# Patient Record
Sex: Female | Born: 1970 | Race: Black or African American | Hispanic: No | Smoking: Never smoker
Health system: Southern US, Community
[De-identification: ages and names within clinical notes are randomized; demographics above are authoritative.]

## PROBLEM LIST (undated history)

## (undated) DIAGNOSIS — E119 Type 2 diabetes mellitus without complications: Secondary | ICD-10-CM

## (undated) DIAGNOSIS — I10 Essential (primary) hypertension: Secondary | ICD-10-CM

## (undated) DIAGNOSIS — E785 Hyperlipidemia, unspecified: Secondary | ICD-10-CM

## (undated) DIAGNOSIS — J40 Bronchitis, not specified as acute or chronic: Secondary | ICD-10-CM

## (undated) DIAGNOSIS — E669 Obesity, unspecified: Secondary | ICD-10-CM

## (undated) DIAGNOSIS — R609 Edema, unspecified: Secondary | ICD-10-CM

## (undated) DIAGNOSIS — Z794 Long term (current) use of insulin: Secondary | ICD-10-CM

## (undated) DIAGNOSIS — IMO0001 Reserved for inherently not codable concepts without codable children: Secondary | ICD-10-CM

## (undated) DIAGNOSIS — G4700 Insomnia, unspecified: Secondary | ICD-10-CM

## (undated) DIAGNOSIS — K219 Gastro-esophageal reflux disease without esophagitis: Secondary | ICD-10-CM

## (undated) HISTORY — DX: Insomnia, unspecified: G47.00

## (undated) HISTORY — DX: Essential (primary) hypertension: I10

## (undated) HISTORY — DX: Long term (current) use of insulin: Z79.4

## (undated) HISTORY — DX: Obesity, unspecified: E66.9

## (undated) HISTORY — DX: Edema, unspecified: R60.9

## (undated) HISTORY — DX: Gastro-esophageal reflux disease without esophagitis: K21.9

## (undated) HISTORY — PX: NO PAST SURGERIES: SHX2092

## (undated) HISTORY — DX: Bronchitis, not specified as acute or chronic: J40

## (undated) HISTORY — DX: Hyperlipidemia, unspecified: E78.5

## (undated) HISTORY — DX: Reserved for inherently not codable concepts without codable children: IMO0001

## (undated) HISTORY — DX: Type 2 diabetes mellitus without complications: E11.9

---

## 2011-05-10 ENCOUNTER — Emergency Department (HOSPITAL_COMMUNITY): Payer: Managed Care, Other (non HMO)

## 2011-05-10 ENCOUNTER — Emergency Department (HOSPITAL_COMMUNITY)
Admission: EM | Admit: 2011-05-10 | Discharge: 2011-05-11 | Disposition: A | Discharge status: Home / Self Care | Payer: Managed Care, Other (non HMO) | Attending: Emergency Medicine | Admitting: Emergency Medicine

## 2011-05-10 DIAGNOSIS — E119 Type 2 diabetes mellitus without complications: Secondary | ICD-10-CM | POA: Insufficient documentation

## 2011-05-10 DIAGNOSIS — M79609 Pain in unspecified limb: Secondary | ICD-10-CM | POA: Insufficient documentation

## 2011-05-10 DIAGNOSIS — I10 Essential (primary) hypertension: Secondary | ICD-10-CM | POA: Insufficient documentation

## 2011-05-10 DIAGNOSIS — M25519 Pain in unspecified shoulder: Secondary | ICD-10-CM | POA: Insufficient documentation

## 2011-05-10 DIAGNOSIS — Z79899 Other long term (current) drug therapy: Secondary | ICD-10-CM | POA: Insufficient documentation

## 2011-05-10 DIAGNOSIS — E7800 Pure hypercholesterolemia, unspecified: Secondary | ICD-10-CM | POA: Insufficient documentation

## 2011-05-10 DIAGNOSIS — R079 Chest pain, unspecified: Secondary | ICD-10-CM | POA: Insufficient documentation

## 2011-05-10 LAB — COMPREHENSIVE METABOLIC PANEL
ALT: 13 U/L (ref 0–35)
Albumin: 3.2 g/dL — ABNORMAL LOW (ref 3.5–5.2)
Alkaline Phosphatase: 71 U/L (ref 39–117)
Potassium: 3.9 mEq/L (ref 3.5–5.1)
Sodium: 138 mEq/L (ref 135–145)
Total Protein: 6.9 g/dL (ref 6.0–8.3)

## 2011-05-10 LAB — URINE MICROSCOPIC-ADD ON

## 2011-05-10 LAB — CK TOTAL AND CKMB (NOT AT ARMC)
CK, MB: 1.9 ng/mL (ref 0.3–4.0)
Relative Index: 1.2 (ref 0.0–2.5)

## 2011-05-10 LAB — DIFFERENTIAL
Basophils Absolute: 0.1 10*3/uL (ref 0.0–0.1)
Lymphocytes Relative: 42 % (ref 12–46)
Neutro Abs: 4.5 10*3/uL (ref 1.7–7.7)

## 2011-05-10 LAB — CBC
HCT: 38.4 % (ref 36.0–46.0)
Hemoglobin: 12.8 g/dL (ref 12.0–15.0)
WBC: 9.7 10*3/uL (ref 4.0–10.5)

## 2011-05-10 LAB — URINALYSIS, ROUTINE W REFLEX MICROSCOPIC
Bilirubin Urine: NEGATIVE
Nitrite: NEGATIVE
Specific Gravity, Urine: 1.017 (ref 1.005–1.030)
pH: 5 (ref 5.0–8.0)

## 2011-05-10 LAB — TROPONIN I: Troponin I: <0.3 ng/mL (ref <0.30)

## 2011-05-10 LAB — POCT PREGNANCY, URINE: Preg Test, Ur: NEGATIVE

## 2011-05-11 LAB — CK TOTAL AND CKMB (NOT AT ARMC)
CK, MB: 1.8 ng/mL (ref 0.3–4.0)
Relative Index: 1.2 (ref 0.0–2.5)
Total CK: 155 U/L (ref 7–177)

## 2011-05-11 LAB — TROPONIN I: Troponin I: <0.3 ng/mL (ref <0.30)

## 2011-10-10 ENCOUNTER — Other Ambulatory Visit: Payer: Self-pay | Admitting: Gastroenterology

## 2011-10-10 DIAGNOSIS — K219 Gastro-esophageal reflux disease without esophagitis: Secondary | ICD-10-CM

## 2011-10-15 ENCOUNTER — Other Ambulatory Visit (HOSPITAL_COMMUNITY): Payer: Managed Care, Other (non HMO)

## 2011-10-25 ENCOUNTER — Encounter (HOSPITAL_COMMUNITY)
Admission: RE | Admit: 2011-10-25 | Discharge: 2011-10-25 | Disposition: A | Discharge status: Home / Self Care | Payer: Managed Care, Other (non HMO) | Source: Ambulatory Visit | Attending: Gastroenterology | Admitting: Gastroenterology

## 2011-10-25 DIAGNOSIS — K219 Gastro-esophageal reflux disease without esophagitis: Secondary | ICD-10-CM

## 2011-10-25 MED ORDER — TECHNETIUM TC 99M MEBROFENIN IV KIT
5.4000 | PACK | Freq: Once | INTRAVENOUS | Status: AC | PRN
  Administered 2011-10-25: 5 via INTRAVENOUS

## 2011-10-25 MED ORDER — SINCALIDE 5 MCG IJ SOLR: 0.0200 ug/kg | Freq: Once | INTRAMUSCULAR | Status: DC

## 2011-11-22 ENCOUNTER — Encounter (INDEPENDENT_AMBULATORY_CARE_PROVIDER_SITE_OTHER): Payer: Self-pay

## 2011-11-26 ENCOUNTER — Ambulatory Visit (INDEPENDENT_AMBULATORY_CARE_PROVIDER_SITE_OTHER): Payer: Managed Care, Other (non HMO) | Admitting: General Surgery

## 2011-11-26 DIAGNOSIS — K828 Other specified diseases of gallbladder: Secondary | ICD-10-CM | POA: Insufficient documentation

## 2011-11-28 ENCOUNTER — Encounter (INDEPENDENT_AMBULATORY_CARE_PROVIDER_SITE_OTHER): Payer: Self-pay | Admitting: General Surgery

## 2012-01-14 ENCOUNTER — Ambulatory Visit (INDEPENDENT_AMBULATORY_CARE_PROVIDER_SITE_OTHER): Payer: Managed Care, Other (non HMO) | Admitting: General Surgery

## 2012-01-14 ENCOUNTER — Encounter (INDEPENDENT_AMBULATORY_CARE_PROVIDER_SITE_OTHER): Payer: Self-pay | Admitting: General Surgery

## 2012-01-14 VITALS — BP 142/88 | Temp 98.4°F | Ht 67.0 in | Wt 291.0 lb

## 2012-01-14 DIAGNOSIS — K828 Other specified diseases of gallbladder: Secondary | ICD-10-CM

## 2012-01-14 NOTE — Progress Notes (Signed)
Patient ID: Andrea Wang, female   DOB: 09/15/1970, 41 y.o.   MRN: [id number]  No chief complaint on file.   HPI Andrea Wang is a 41 y.o. female.  Symptomatic biliary dyskinesia HPI  Past Medical History  Diagnosis Date  . IDDM (insulin dependent diabetes mellitus)   . Hyperlipidemia   . Hypertension   . GERD (gastroesophageal reflux disease)   . Bronchitis   . Asthma   . Peripheral edema   . Insomnia   . Obesity   . Diabetes mellitus     No past surgical history on file.  Family History  Problem Relation Age of Onset  . Ovarian cancer Maternal Grandmother   . Ovarian cancer Maternal Aunt   . Heart failure Mother   . Aneurysm Father     Social History History  Substance Use Topics  . Smoking status: Never Smoker   . Smokeless tobacco: Not on file  . Alcohol Use: No    No Known Allergies  Current Outpatient Prescriptions  Medication Sig Dispense Refill  . AMLODIPINE BESYLATE PO Take by mouth.      . aspirin 81 MG tablet Take 81 mg by mouth daily.      . atorvastatin (LIPITOR) 10 MG tablet Take 10 mg by mouth daily.      . Dexlansoprazole (DEXILANT PO) Take by mouth.      . Docusate Calcium (STOOL SOFTENER PO) Take by mouth.      . insulin NPH-insulin regular (NOVOLIN 70/30) (70-30) 100 UNIT/ML injection Inject into the skin.      . lisinopril (PRINIVIL,ZESTRIL) 5 MG tablet Take 5 mg by mouth daily.      . METFORMIN HCL PO Take by mouth.        Review of Systems Review of Systems  Constitutional: Negative.   HENT: Negative.   Gastrointestinal: Positive for abdominal pain (left upper quadrant).  Genitourinary: Negative.   Musculoskeletal: Negative.   Neurological: Positive for numbness (right arm numbness).    Blood pressure 142/88, temperature 98.4 F (36.9 C), height 5' 7" (1.702 m), weight 291 lb (131.997 kg).  Physical Exam Physical Exam  Constitutional: She is oriented to person, place, and time. She appears well-developed and well-nourished.    HENT:  Head: Normocephalic and atraumatic.  Eyes: Conjunctivae and EOM are normal. Pupils are equal, round, and reactive to light.  Neck: Normal range of motion. Neck supple.  Cardiovascular: Normal rate, regular rhythm and normal heart sounds.   Pulmonary/Chest: Effort normal and breath sounds normal.  Abdominal: Soft. Bowel sounds are normal.  Musculoskeletal: Normal range of motion.  Neurological: She is alert and oriented to person, place, and time. She has normal reflexes.  Skin: Skin is warm and dry.  Psychiatric: She has a normal mood and affect. Her behavior is normal. Judgment and thought content normal.    Data Reviewed HIDA scan and ultrasound.  Assessment    Symptomatic biliary dyskinesia with no stones on ultrasound.    Plan    Laparoscopic cholecystectomy with possible IOC Risk and benefits have been explained to the patient and she wishes to proceed.       Arloa Prak III,Asif Muchow O 01/14/2012, 11:09 AM    

## 2012-01-24 ENCOUNTER — Encounter (HOSPITAL_COMMUNITY): Payer: Self-pay | Admitting: *Deleted

## 2012-01-24 NOTE — Pre-Procedure Instructions (Addendum)
20 Andrea Wang  01/24/2012   Your procedure is scheduled on: 02-03-2012 Report to [facility] at 5:30 AM.  Call this number if you have problems the morning of surgery: [phone]   Remember:   Do not eat or drink:After Midnight.    Take these medicines the morning of surgery with A SIP OF WATER:amlodipine,dexilant.   Do not wear jewelry, make-up or nail polish.  Do not wear lotions, powders, or perfumes. You may wear deodorant.  Do not shave 48 hours prior to surgery. Men may shave face and neck.  Do not bring valuables to the hospital.  Contacts, dentures or bridgework may not be worn into surgery.  Leave suitcase in the car. After surgery it may be brought to your room.  For patients admitted to the hospital, checkout time is 11:00 AM the day of discharge.   Patients discharged the day of surgery will not be allowed to drive home.  Name and phone number of your driver:  Special Instructions: CHG Shower Use Special Wash: 1/2 bottle night before surgery and 1/2 bottle morning of surgery.   Please read over the following fact sheets that you were given: Pain Booklet, Coughing and Deep Breathing and Surgical Site Infection Prevention

## 2012-01-24 NOTE — Progress Notes (Signed)
Attempted to call pt. To review health history,unable to reach, message left on voice mail .

## 2012-01-29 ENCOUNTER — Encounter (HOSPITAL_COMMUNITY)
Admission: RE | Admit: 2012-01-29 | Discharge: 2012-01-29 | Disposition: A | Discharge status: Home / Self Care | Payer: Managed Care, Other (non HMO) | Source: Ambulatory Visit | Attending: General Surgery | Admitting: General Surgery

## 2012-01-29 ENCOUNTER — Encounter (HOSPITAL_COMMUNITY): Payer: Self-pay | Admitting: Pharmacy Technician

## 2012-01-29 LAB — COMPREHENSIVE METABOLIC PANEL
ALT: 13 U/L (ref 0–35)
AST: 17 U/L (ref 0–37)
Alkaline Phosphatase: 70 U/L (ref 39–117)
CO2: 24 mEq/L (ref 19–32)
Calcium: 9.5 mg/dL (ref 8.4–10.5)
GFR calc non Af Amer: 47 mL/min — ABNORMAL LOW (ref >90)
Potassium: 4.5 mEq/L (ref 3.5–5.1)
Sodium: 138 mEq/L (ref 135–145)

## 2012-01-29 LAB — DIFFERENTIAL
Eosinophils Absolute: 0.4 10*3/uL (ref 0.0–0.7)
Eosinophils Relative: 4 % (ref 0–5)
Lymphocytes Relative: 37 % (ref 12–46)
Lymphs Abs: 3.3 10*3/uL (ref 0.7–4.0)
Monocytes Absolute: 0.6 10*3/uL (ref 0.1–1.0)

## 2012-01-29 LAB — HCG, SERUM, QUALITATIVE: Preg, Serum: NEGATIVE

## 2012-01-29 LAB — CBC
Hemoglobin: 11.4 g/dL — ABNORMAL LOW (ref 12.0–15.0)
MCH: 25.1 pg — ABNORMAL LOW (ref 26.0–34.0)
Platelets: 278 10*3/uL (ref 150–400)
RBC: 4.55 MIL/uL (ref 3.87–5.11)
WBC: 8.9 10*3/uL (ref 4.0–10.5)

## 2012-02-02 MED ORDER — DEXTROSE 5 % IV SOLN
2.0000 g | INTRAVENOUS | Status: AC
  Administered 2012-02-03: 2 g via INTRAVENOUS
  Filled 2012-02-02: qty 2

## 2012-02-03 ENCOUNTER — Ambulatory Visit (HOSPITAL_COMMUNITY)
Admission: RE | Admit: 2012-02-03 | Discharge: 2012-02-03 | Disposition: A | Discharge status: Home / Self Care | Payer: Managed Care, Other (non HMO) | Source: Ambulatory Visit | Attending: General Surgery | Admitting: General Surgery

## 2012-02-03 ENCOUNTER — Ambulatory Visit (HOSPITAL_COMMUNITY): Payer: Managed Care, Other (non HMO) | Admitting: Anesthesiology

## 2012-02-03 ENCOUNTER — Encounter (HOSPITAL_COMMUNITY): Payer: Self-pay | Admitting: Anesthesiology

## 2012-02-03 ENCOUNTER — Encounter (HOSPITAL_COMMUNITY): Payer: Self-pay | Admitting: *Deleted

## 2012-02-03 ENCOUNTER — Encounter (HOSPITAL_COMMUNITY)
Admission: RE | Disposition: A | Discharge status: Home / Self Care | Payer: Self-pay | Source: Ambulatory Visit | Attending: General Surgery

## 2012-02-03 DIAGNOSIS — I10 Essential (primary) hypertension: Secondary | ICD-10-CM | POA: Insufficient documentation

## 2012-02-03 DIAGNOSIS — K8010 Calculus of gallbladder with chronic cholecystitis without obstruction: Secondary | ICD-10-CM

## 2012-02-03 DIAGNOSIS — E669 Obesity, unspecified: Secondary | ICD-10-CM | POA: Insufficient documentation

## 2012-02-03 DIAGNOSIS — Z794 Long term (current) use of insulin: Secondary | ICD-10-CM | POA: Insufficient documentation

## 2012-02-03 DIAGNOSIS — K219 Gastro-esophageal reflux disease without esophagitis: Secondary | ICD-10-CM | POA: Insufficient documentation

## 2012-02-03 DIAGNOSIS — J45909 Unspecified asthma, uncomplicated: Secondary | ICD-10-CM | POA: Insufficient documentation

## 2012-02-03 DIAGNOSIS — E119 Type 2 diabetes mellitus without complications: Secondary | ICD-10-CM | POA: Insufficient documentation

## 2012-02-03 DIAGNOSIS — Z01812 Encounter for preprocedural laboratory examination: Secondary | ICD-10-CM | POA: Insufficient documentation

## 2012-02-03 DIAGNOSIS — K828 Other specified diseases of gallbladder: Secondary | ICD-10-CM | POA: Insufficient documentation

## 2012-02-03 DIAGNOSIS — E785 Hyperlipidemia, unspecified: Secondary | ICD-10-CM | POA: Insufficient documentation

## 2012-02-03 HISTORY — PX: CHOLECYSTECTOMY: SHX55

## 2012-02-03 LAB — GLUCOSE, CAPILLARY
Glucose-Capillary: 107 mg/dL — ABNORMAL HIGH (ref 70–99)
Glucose-Capillary: 100 mg/dL — ABNORMAL HIGH (ref 70–99)
Glucose-Capillary: 102 mg/dL — ABNORMAL HIGH (ref 70–99)

## 2012-02-03 SURGERY — LAPAROSCOPIC CHOLECYSTECTOMY
Anesthesia: General | Site: Abdomen | Wound class: Clean Contaminated

## 2012-02-03 MED ORDER — NEOSTIGMINE METHYLSULFATE 1 MG/ML IJ SOLN
INTRAMUSCULAR | Status: DC | PRN
  Administered 2012-02-03: 3 mg via INTRAVENOUS

## 2012-02-03 MED ORDER — HYDROMORPHONE HCL PF 1 MG/ML IJ SOLN
INTRAMUSCULAR | Status: AC
  Filled 2012-02-03: qty 1

## 2012-02-03 MED ORDER — IOHEXOL 300 MG/ML  SOLN
INTRAMUSCULAR | Status: DC | PRN
  Administered 2012-02-03: 07:00:00

## 2012-02-03 MED ORDER — LACTATED RINGERS IV SOLN
INTRAVENOUS | Status: DC | PRN
  Administered 2012-02-03 (×2): via INTRAVENOUS

## 2012-02-03 MED ORDER — FENTANYL CITRATE 0.05 MG/ML IJ SOLN
INTRAMUSCULAR | Status: DC | PRN
  Administered 2012-02-03: 50 ug via INTRAVENOUS
  Administered 2012-02-03: 100 ug via INTRAVENOUS
  Administered 2012-02-03: 50 ug via INTRAVENOUS

## 2012-02-03 MED ORDER — IBUPROFEN 200 MG PO TABS: 200.0000 mg | ORAL_TABLET | Freq: Four times a day (QID) | ORAL | Status: DC | PRN

## 2012-02-03 MED ORDER — KETOROLAC TROMETHAMINE 30 MG/ML IJ SOLN
15.0000 mg | Freq: Once | INTRAMUSCULAR | Status: AC | PRN
  Administered 2012-02-03: 30 mg via INTRAVENOUS

## 2012-02-03 MED ORDER — GLYCOPYRROLATE 0.2 MG/ML IJ SOLN
INTRAMUSCULAR | Status: DC | PRN
  Administered 2012-02-03: 0.4 mg via INTRAVENOUS

## 2012-02-03 MED ORDER — MIDAZOLAM HCL 5 MG/5ML IJ SOLN
INTRAMUSCULAR | Status: DC | PRN
  Administered 2012-02-03: 2 mg via INTRAVENOUS

## 2012-02-03 MED ORDER — LIDOCAINE HCL (CARDIAC) 20 MG/ML IV SOLN
INTRAVENOUS | Status: DC | PRN
  Administered 2012-02-03: 100 mg via INTRAVENOUS

## 2012-02-03 MED ORDER — KETOROLAC TROMETHAMINE 30 MG/ML IJ SOLN
INTRAMUSCULAR | Status: AC
  Filled 2012-02-03: qty 1

## 2012-02-03 MED ORDER — MEPERIDINE HCL 25 MG/ML IJ SOLN: 6.2500 mg | INTRAMUSCULAR | Status: DC | PRN

## 2012-02-03 MED ORDER — OXYCODONE-ACETAMINOPHEN 7.5-500 MG PO TABS: 1.0000 | ORAL_TABLET | Freq: Four times a day (QID) | ORAL | Status: AC | PRN

## 2012-02-03 MED ORDER — SODIUM CHLORIDE 0.9 % IR SOLN
Status: DC | PRN
  Administered 2012-02-03 (×2): 1000 mL

## 2012-02-03 MED ORDER — BUPIVACAINE-EPINEPHRINE 0.25% -1:200000 IJ SOLN
INTRAMUSCULAR | Status: DC | PRN
  Administered 2012-02-03: 10 mL

## 2012-02-03 MED ORDER — PROPOFOL 10 MG/ML IV EMUL
INTRAVENOUS | Status: DC | PRN
  Administered 2012-02-03: 170 mg via INTRAVENOUS

## 2012-02-03 MED ORDER — ROCURONIUM BROMIDE 100 MG/10ML IV SOLN
INTRAVENOUS | Status: DC | PRN
  Administered 2012-02-03: 50 mg via INTRAVENOUS

## 2012-02-03 MED ORDER — OXYCODONE HCL 5 MG PO TABS: 5.0000 mg | ORAL_TABLET | Freq: Once | ORAL | Status: DC | PRN

## 2012-02-03 MED ORDER — ONDANSETRON HCL 4 MG/2ML IJ SOLN
INTRAMUSCULAR | Status: DC | PRN
  Administered 2012-02-03: 4 mg via INTRAVENOUS

## 2012-02-03 MED ORDER — HYDROMORPHONE HCL PF 1 MG/ML IJ SOLN
0.2500 mg | INTRAMUSCULAR | Status: DC | PRN
  Administered 2012-02-03 (×2): 0.5 mg via INTRAVENOUS

## 2012-02-03 SURGICAL SUPPLY — 42 items
APPLIER CLIP 5 13 M/L LIGAMAX5 (MISCELLANEOUS) ×3
APPLIER CLIP ROT 10 11.4 M/L (STAPLE)
BLADE SURG ROTATE 9660 (MISCELLANEOUS) IMPLANT
CANISTER SUCTION 2500CC (MISCELLANEOUS) ×3 IMPLANT
CHLORAPREP W/TINT 26ML (MISCELLANEOUS) ×3 IMPLANT
CLIP APPLIE 5 13 M/L LIGAMAX5 (MISCELLANEOUS) ×2 IMPLANT
CLIP APPLIE ROT 10 11.4 M/L (STAPLE) IMPLANT
CLOTH BEACON ORANGE TIMEOUT ST (SAFETY) ×3 IMPLANT
COVER MAYO STAND STRL (DRAPES) ×3 IMPLANT
COVER SURGICAL LIGHT HANDLE (MISCELLANEOUS) ×3 IMPLANT
DECANTER SPIKE VIAL GLASS SM (MISCELLANEOUS) IMPLANT
DERMABOND ADVANCED (GAUZE/BANDAGES/DRESSINGS) ×1
DERMABOND ADVANCED .7 DNX12 (GAUZE/BANDAGES/DRESSINGS) ×2 IMPLANT
DRAPE C-ARM 42X72 X-RAY (DRAPES) ×3 IMPLANT
DRAPE UTILITY 15X26 W/TAPE STR (DRAPE) ×6 IMPLANT
ELECT REM PT RETURN 9FT ADLT (ELECTROSURGICAL) ×3
ELECTRODE REM PT RTRN 9FT ADLT (ELECTROSURGICAL) ×2 IMPLANT
GLOVE BIOGEL PI IND STRL 7.0 (GLOVE) ×2 IMPLANT
GLOVE BIOGEL PI IND STRL 8 (GLOVE) ×2 IMPLANT
GLOVE BIOGEL PI INDICATOR 7.0 (GLOVE) ×1
GLOVE BIOGEL PI INDICATOR 8 (GLOVE) ×1
GLOVE ECLIPSE 6.5 STRL STRAW (GLOVE) ×3 IMPLANT
GLOVE ECLIPSE 7.5 STRL STRAW (GLOVE) ×3 IMPLANT
GOWN STRL NON-REIN LRG LVL3 (GOWN DISPOSABLE) ×12 IMPLANT
KIT BASIN OR (CUSTOM PROCEDURE TRAY) ×3 IMPLANT
KIT ROOM TURNOVER OR (KITS) ×3 IMPLANT
NS IRRIG 1000ML POUR BTL (IV SOLUTION) ×3 IMPLANT
PAD ARMBOARD 7.5X6 YLW CONV (MISCELLANEOUS) IMPLANT
POUCH SPECIMEN RETRIEVAL 10MM (ENDOMECHANICALS) ×3 IMPLANT
SCISSORS LAP 5X35 DISP (ENDOMECHANICALS) ×3 IMPLANT
SET CHOLANGIOGRAPH 5 50 .035 (SET/KITS/TRAYS/PACK) ×3 IMPLANT
SET IRRIG TUBING LAPAROSCOPIC (IRRIGATION / IRRIGATOR) ×3 IMPLANT
SLEEVE ENDOPATH XCEL 5M (ENDOMECHANICALS) ×6 IMPLANT
SPECIMEN JAR SMALL (MISCELLANEOUS) ×3 IMPLANT
SUT MNCRL AB 4-0 PS2 18 (SUTURE) ×6 IMPLANT
TOWEL OR 17X24 6PK STRL BLUE (TOWEL DISPOSABLE) ×3 IMPLANT
TOWEL OR 17X26 10 PK STRL BLUE (TOWEL DISPOSABLE) ×3 IMPLANT
TRAY LAPAROSCOPIC (CUSTOM PROCEDURE TRAY) ×3 IMPLANT
TROCAR XCEL BLUNT TIP 100MML (ENDOMECHANICALS) ×3 IMPLANT
TROCAR XCEL NON-BLD 11X100MML (ENDOMECHANICALS) ×3 IMPLANT
TROCAR XCEL NON-BLD 5MMX100MML (ENDOMECHANICALS) ×3 IMPLANT
WATER STERILE IRR 1000ML POUR (IV SOLUTION) IMPLANT

## 2012-02-03 NOTE — Progress Notes (Signed)
Patient encouraged to drink fluid. Patient reports that pain is 5/10 and is tolerable. Patient continues to drink ginger ale and eat ice chips.

## 2012-02-03 NOTE — Interval H&P Note (Signed)
History and Physical Interval Note:  02/03/2012 7:08 AM  Andrea Wang  has presented today for surgery, with the diagnosis of Symptomatic biliary dyskinesia  The various methods of treatment have been discussed with the patient and family. After consideration of risks, benefits and other options for treatment, the patient has consented to  Procedure(s) (LRB): LAPAROSCOPIC CHOLECYSTECTOMY WITH INTRAOPERATIVE CHOLANGIOGRAM (N/A) as a surgical intervention .  The patients' history has been reviewed, patient examined, no change in status, stable for surgery.  I have reviewed the patients' chart and labs.  Questions were answered to the patient's satisfaction.     Andrea Wang O  No worsening concerns.  Ready for the OR.  Andrea Wang. Andrea Bon, MD, FACS [phone] [facility]

## 2012-02-03 NOTE — H&P (View-Only) (Signed)
Patient ID: Andrea Wang, female   DOB: 03-16-1971, 41 y.o.   MRN: [id number]  No chief complaint on file.   HPI Andrea Wang is a 41 y.o. female.  Symptomatic biliary dyskinesia HPI  Past Medical History  Diagnosis Date  . IDDM (insulin dependent diabetes mellitus)   . Hyperlipidemia   . Hypertension   . GERD (gastroesophageal reflux disease)   . Bronchitis   . Asthma   . Peripheral edema   . Insomnia   . Obesity   . Diabetes mellitus     No past surgical history on file.  Family History  Problem Relation Age of Onset  . Ovarian cancer Maternal Grandmother   . Ovarian cancer Maternal Aunt   . Heart failure Mother   . Aneurysm Father     Social History History  Substance Use Topics  . Smoking status: Never Smoker   . Smokeless tobacco: Not on file  . Alcohol Use: No    No Known Allergies  Current Outpatient Prescriptions  Medication Sig Dispense Refill  . AMLODIPINE BESYLATE PO Take by mouth.      Marland Kitchen aspirin 81 MG tablet Take 81 mg by mouth daily.      Marland Kitchen atorvastatin (LIPITOR) 10 MG tablet Take 10 mg by mouth daily.      Marland Kitchen Dexlansoprazole (DEXILANT PO) Take by mouth.      Tery Sanfilippo Calcium (STOOL SOFTENER PO) Take by mouth.      . insulin NPH-insulin regular (NOVOLIN 70/30) (70-30) 100 UNIT/ML injection Inject into the skin.      Marland Kitchen lisinopril (PRINIVIL,ZESTRIL) 5 MG tablet Take 5 mg by mouth daily.      Marland Kitchen METFORMIN HCL PO Take by mouth.        Review of Systems Review of Systems  Constitutional: Negative.   HENT: Negative.   Gastrointestinal: Positive for abdominal pain (left upper quadrant).  Genitourinary: Negative.   Musculoskeletal: Negative.   Neurological: Positive for numbness (right arm numbness).    Blood pressure 142/88, temperature 98.4 F (36.9 C), height 5\' 7"  (1.702 m), weight 291 lb (131.997 kg).  Physical Exam Physical Exam  Constitutional: She is oriented to person, place, and time. She appears well-developed and well-nourished.    HENT:  Head: Normocephalic and atraumatic.  Eyes: Conjunctivae and EOM are normal. Pupils are equal, round, and reactive to light.  Neck: Normal range of motion. Neck supple.  Cardiovascular: Normal rate, regular rhythm and normal heart sounds.   Pulmonary/Chest: Effort normal and breath sounds normal.  Abdominal: Soft. Bowel sounds are normal.  Musculoskeletal: Normal range of motion.  Neurological: She is alert and oriented to person, place, and time. She has normal reflexes.  Skin: Skin is warm and dry.  Psychiatric: She has a normal mood and affect. Her behavior is normal. Judgment and thought content normal.    Data Reviewed HIDA scan and ultrasound.  Assessment    Symptomatic biliary dyskinesia with no stones on ultrasound.    Plan    Laparoscopic cholecystectomy with possible IOC Risk and benefits have been explained to the patient and she wishes to proceed.       Jaree Trinka III,Lea Walbert O 01/14/2012, 11:09 AM

## 2012-02-03 NOTE — Anesthesia Procedure Notes (Addendum)
Procedure Name: Intubation Date/Time: 02/03/2012 7:39 AM Performed by: Jerilee Hoh Pre-anesthesia Checklist: Patient identified, Emergency Drugs available, Suction available and Patient being monitored Patient Re-evaluated:Patient Re-evaluated prior to inductionOxygen Delivery Method: Circle system utilized Preoxygenation: Pre-oxygenation with 100% oxygen Intubation Type: IV induction Ventilation: Mask ventilation without difficulty Laryngoscope Size: Mac and 3 Grade View: Grade II Tube type: Oral Tube size: 7.5 mm Number of attempts: 1 Airway Equipment and Method: Stylet Placement Confirmation: ETT inserted through vocal cords under direct vision,  positive ETCO2 and breath sounds checked- equal and bilateral Secured at: 22 cm Tube secured with: Tape Dental Injury: Teeth and Oropharynx as per pre-operative assessment

## 2012-02-03 NOTE — Progress Notes (Signed)
Patient's IV was DCd intact. Site unremarkable. Right upper transparent dressing-had small amount bloody drainage under dressing when she got up to urinate. Rechecked after 20 minutes of sitting on edge of bed with no increase in drainage. Patient aware of dressing DC instructions.

## 2012-02-03 NOTE — Anesthesia Preprocedure Evaluation (Addendum)
Anesthesia Evaluation  Patient identified by MRN, date of birth, ID band Patient awake    Reviewed: Allergy & Precautions, H&P , NPO status , Patient's Chart, lab work & pertinent test results, reviewed documented beta blocker date and time   History of Anesthesia Complications Negative for: history of anesthetic complications  Airway Mallampati: I TM Distance: >3 FB Neck ROM: Full    Dental  (+) Teeth Intact and Dental Advisory Given   Pulmonary asthma ,  breath sounds clear to auscultation        Cardiovascular hypertension, Rhythm:Regular Rate:Normal     Neuro/Psych    GI/Hepatic GERD-  ,  Endo/Other  Diabetes mellitus-, Insulin Dependent  Renal/GU      Musculoskeletal   Abdominal (+) + obese,   Peds  Hematology   Anesthesia Other Findings   Reproductive/Obstetrics                          Anesthesia Physical Anesthesia Plan  ASA: III  Anesthesia Plan: General   Post-op Pain Management:    Induction: Intravenous  Airway Management Planned: Oral ETT  Additional Equipment:   Intra-op Plan:   Post-operative Plan:   Informed Consent: I have reviewed the patients History and Physical, chart, labs and discussed the procedure including the risks, benefits and alternatives for the proposed anesthesia with the patient or authorized representative who has indicated his/her understanding and acceptance.   Dental advisory given  Plan Discussed with: CRNA, Surgeon and Anesthesiologist  Anesthesia Plan Comments:        Anesthesia Quick Evaluation

## 2012-02-03 NOTE — Transfer of Care (Signed)
Immediate Anesthesia Transfer of Care Note  Patient: Andrea Wang  Procedure(s) Performed: Procedure(s) (LRB): LAPAROSCOPIC CHOLECYSTECTOMY (N/A)  Patient Location: PACU  Anesthesia Type: General  Level of Consciousness: awake, alert , oriented and patient cooperative  Airway & Oxygen Therapy: Patient Spontanous Breathing and Patient connected to nasal cannula oxygen  Post-op Assessment: Report given to PACU RN, Post -op Vital signs reviewed and stable and Patient moving all extremities  Post vital signs: Reviewed and stable  Complications: No apparent anesthesia complications

## 2012-02-03 NOTE — Op Note (Signed)
OPERATIVE REPORT  DATE OF OPERATION: 02/03/2012  PATIENT:  Andrea Wang  41 y.o. female  PRE-OPERATIVE DIAGNOSIS:  Symptomatic biliary dyskinesia  POST-OPERATIVE DIAGNOSIS:  Symptomatic biliary dyskinesia  PROCEDURE:  Procedure(s): LAPAROSCOPIC CHOLECYSTECTOMY  SURGEON:  Surgeon(s): Cherylynn Ridges, MD  ASSISTANT: [place], PA-S  ANESTHESIA:   general  EBL: <20 ml  BLOOD ADMINISTERED: none  DRAINS: none   SPECIMEN:  Source of Specimen:  Galbladder  COUNTS CORRECT:  YES  PROCEDURE DETAILS: The patient was taken to the operating room and placed on the table in the supine position.  After an adequate endotracheal anesthetic was administered, (she/he) was prepped with (ChloroPrep/Betadine), and then draped in the usual manner exposing the entire abdomen laterally, inferiorly and up  to the costal margins.  After a proper timeout was performed including identifying the patient and the procedure to be performed, a supra-umbilical1.5cm midline incision was made using a #15blade.  This was taken down to the fascia which was then incised with a #15 blade.  The edges of the fascia were tented up with Kocher clamps as the preperitoneal space was penetrated with a Kelly clamp into the peritoneum.  Once this was done, a pursestring suture of 0 Vicryl was passed around the fascial opening.  This was subsequently used to secure the [facility] cannula which was passed into the peritoneal cavity.  Once the [facility] cannula was in place, carbon dioxide gas was insufflated into the peritoneal cavity up to a maximal intra-abdominal pressure of 15mm Hg.The laparoscope, with attached camera and light source, was passed into the peritoneal cavity to visualize the direct insertion of two right upper quadrant 5mm cannulas, and a sup-xiphoid 10-22mm cannula.  Once all cannulas were in place, the dissection was begun.  Two ratcheted graspers were attached to the dome and infundibulum of the gallbladder and  retracted towards the anterior abdominal wall and the right upper quadrant.  Using cautery attached to a dissecting forceps, the peritoneum overlaying the triangle of Chalot and the hepatoduodenal triangle was dissected away exposing the cystic duct and the cystic artery.  A clip was placed on the gallbladder side of the cystic duct, then 3 clips placed distally and the duct was transected.  The cystic artery was dissected free then clipped and transected.  The gallbladder was then dissected out of the hepatic bed without event.  It was retrieved from the abdomen (using an EndoCatch bag) without event, although bile spilled form the gallbladder from small holes that were made in the body.  Most of the spilled bile was removed using the irrigation and aspiration.  Once the gallbladder was removed, the bed was inspected for hemostasis.  Once excellent hemostasis was obtained all gas and fluids were aspirated from above the liver, then the cannulas were removed.  The supra-umbilical incision was closed using the pursestring suture which was in place.  0.25% bupivicaine with epinephrine was injected at all sites.  All 10mm or greater cannula sites were close using a running subcuticular stitch of 4-0 Monocryl.  5.72mm cannula sites were closed with Dermabond only.Steri-Strips and Tagaderm were used to complete the dressings at all sites.  At this point all needle, sponge, and instrument counts were correct.The patient was awakened from anesthesia and taken to the PACU in stable condition.  PATIENT DISPOSITION:  PACU - hemodynamically stable.   Cherylynn Ridges 6/17/20138:58 AM

## 2012-02-03 NOTE — Preoperative (Signed)
Beta Blockers   Reason not to administer Beta Blockers:Not Applicable 

## 2012-02-03 NOTE — Anesthesia Postprocedure Evaluation (Signed)
  Anesthesia Post-op Note  Patient: Andrea Wang  Procedure(s) Performed: Procedure(s) (LRB): LAPAROSCOPIC CHOLECYSTECTOMY (N/A)  Patient Location: PACU  Anesthesia Type: General  Level of Consciousness: awake and sedated  Airway and Oxygen Therapy: Patient Spontanous Breathing  Post-op Pain: mild  Post-op Assessment: Post-op Vital signs reviewed  Post-op Vital Signs: stable  Complications: No apparent anesthesia complications

## 2012-02-03 NOTE — Progress Notes (Signed)
Report given to stephanie rn as caregiver 

## 2012-02-04 ENCOUNTER — Telehealth (INDEPENDENT_AMBULATORY_CARE_PROVIDER_SITE_OTHER): Payer: Self-pay | Admitting: General Surgery

## 2012-02-04 ENCOUNTER — Encounter (HOSPITAL_COMMUNITY): Payer: Self-pay | Admitting: General Surgery

## 2012-02-04 NOTE — Telephone Encounter (Signed)
Pt home from lap chole and asking for clarification of instructions for removing dressing to bathe.  Instructions discussed and she understands.

## 2012-02-05 ENCOUNTER — Telehealth (INDEPENDENT_AMBULATORY_CARE_PROVIDER_SITE_OTHER): Payer: Self-pay | Admitting: General Surgery

## 2012-02-05 NOTE — Telephone Encounter (Signed)
Sister calling to go over instructions for removing dressing to shower.  Pt called yesterday while just home from surgery and was so sleepy she cannot read the instructions she wrote down.  Went over removing the outer dressing to the steri-strips, shower with soap and water, then pat dry.  Understands and will comply.

## 2012-02-25 ENCOUNTER — Encounter (INDEPENDENT_AMBULATORY_CARE_PROVIDER_SITE_OTHER): Payer: Self-pay | Admitting: General Surgery

## 2012-02-25 ENCOUNTER — Ambulatory Visit (INDEPENDENT_AMBULATORY_CARE_PROVIDER_SITE_OTHER): Payer: Managed Care, Other (non HMO) | Admitting: General Surgery

## 2012-02-25 VITALS — BP 123/84 | HR 80 | Temp 98.2°F | Resp 14 | Ht 67.0 in | Wt 293.8 lb

## 2012-02-25 DIAGNOSIS — Z09 Encounter for follow-up examination after completed treatment for conditions other than malignant neoplasm: Secondary | ICD-10-CM | POA: Insufficient documentation

## 2012-02-25 NOTE — Progress Notes (Signed)
HPI The patient is status post laparoscopic cholecystectomy and doing very well. She was complaining of some soreness at the subxiphoid incision site.  PE On examination she has no evidence of a recurrent hernia at the subxiphoid site however there was a small piece of suture material that is protruding from the right lateral aspect of the incision. A half and to remove that in the office however the patient declined stating that it would probably be too sore  Studiy review None  Assessment Doing well status post laparoscopic cholecystectomy.  Plan Return to see me on a p.r.n. basis.

## 2012-03-03 ENCOUNTER — Encounter (INDEPENDENT_AMBULATORY_CARE_PROVIDER_SITE_OTHER): Payer: Self-pay

## 2013-06-07 ENCOUNTER — Observation Stay (HOSPITAL_COMMUNITY)
Admission: AD | Admit: 2013-06-07 | Discharge: 2013-06-08 | Disposition: A | Discharge status: Home / Self Care | Payer: Managed Care, Other (non HMO) | Source: Ambulatory Visit | Attending: Obstetrics and Gynecology | Admitting: Obstetrics and Gynecology

## 2013-06-07 DIAGNOSIS — N920 Excessive and frequent menstruation with regular cycle: Principal | ICD-10-CM | POA: Insufficient documentation

## 2013-06-07 DIAGNOSIS — D500 Iron deficiency anemia secondary to blood loss (chronic): Secondary | ICD-10-CM | POA: Insufficient documentation

## 2013-06-07 DIAGNOSIS — E119 Type 2 diabetes mellitus without complications: Secondary | ICD-10-CM | POA: Insufficient documentation

## 2013-06-07 DIAGNOSIS — I10 Essential (primary) hypertension: Secondary | ICD-10-CM | POA: Insufficient documentation

## 2013-06-07 LAB — CBC
Hemoglobin: 8.3 g/dL — ABNORMAL LOW (ref 12.0–15.0)
MCHC: 31.8 g/dL (ref 30.0–36.0)
Platelets: 398 10*3/uL (ref 150–400)
RDW: 15.9 % — ABNORMAL HIGH (ref 11.5–15.5)

## 2013-06-07 LAB — PREPARE RBC (CROSSMATCH)

## 2013-06-07 MED ORDER — ACETAMINOPHEN 325 MG PO TABS
650.0000 mg | ORAL_TABLET | Freq: Once | ORAL | Status: AC
  Administered 2013-06-07: 650 mg via ORAL
  Filled 2013-06-07: qty 2

## 2013-06-07 MED ORDER — MEDROXYPROGESTERONE ACETATE 10 MG PO TABS
10.0000 mg | ORAL_TABLET | Freq: Every day | ORAL | Status: DC
  Administered 2013-06-07: 10 mg via ORAL
  Filled 2013-06-07 (×2): qty 1

## 2013-06-07 MED ORDER — PRENATAL MULTIVITAMIN CH: 1.0000 | ORAL_TABLET | Freq: Every day | ORAL | Status: DC

## 2013-06-07 MED ORDER — SODIUM CHLORIDE 0.9 % IV SOLN
INTRAVENOUS | Status: DC
  Administered 2013-06-07: 17:00:00 via INTRAVENOUS

## 2013-06-07 MED ORDER — MEDROXYPROGESTERONE ACETATE 10 MG PO TABS: 10.0000 mg | ORAL_TABLET | Freq: Every day | ORAL | Status: DC

## 2013-06-07 MED ORDER — FERROUS GLUCONATE 324 (38 FE) MG PO TABS: 324.0000 mg | ORAL_TABLET | Freq: Three times a day (TID) | ORAL | Status: AC

## 2013-06-07 MED ORDER — DIPHENHYDRAMINE HCL 25 MG PO CAPS
25.0000 mg | ORAL_CAPSULE | Freq: Once | ORAL | Status: AC
  Administered 2013-06-07: 25 mg via ORAL
  Filled 2013-06-07: qty 1

## 2013-06-07 NOTE — Discharge Summary (Signed)
Physician Discharge Summary  Patient ID: Andrea Wang MRN: [id number] DOB/AGE: Sep 02, 1970 42 y.o.  Admit date: 06/07/2013 Discharge date: 06/07/2013  Admission Diagnoses:  Symptomatic anemia  Discharge Diagnoses:   SAA Active Problems:   * No active hospital problems. *   Discharged Condition: good  Hospital Course: Admitted for administration of 2 units of packed RBCs.    Consults: None  Significant Diagnostic Studies: n/a  Treatments: procedures: transfusions of 2 units RBCs  Discharge Exam: Blood pressure 124/84, pulse 105, temperature 98.4 F (36.9 C), temperature source Oral, resp. rate 20, SpO2 98.00%. General appearance: alert, cooperative and appears stated age Pelvic: cervix normal in appearance, external genitalia normal and moderate blood in the vault Extremities: extremities normal, atraumatic, no cyanosis or edema  Disposition: 01-Home or Self Care     Medication List    ASK your doctor about these medications       albuterol 108 (90 BASE) MCG/ACT inhaler  Commonly known as:  PROVENTIL HFA;VENTOLIN HFA  Inhale 2 puffs into the lungs every 4 (four) hours as needed for wheezing or shortness of breath (For coughing.).     aspirin EC 81 MG tablet  Take 81 mg by mouth daily.     atorvastatin 10 MG tablet  Commonly known as:  LIPITOR  Take 10 mg by mouth daily.     BRONKAID 25-400 MG Tabs  Generic drug:  Ephedrine-Guaifenesin  Take 2 tablets by mouth at bedtime as needed (For coughing.).     insulin NPH-regular (70-30) 100 UNIT/ML injection  Commonly known as:  NOVOLIN 70/30  Inject 25-40 Units into the skin 2 (two) times daily with a meal. 40 units in the morning and 25 units in the evening     lisinopril-hydrochlorothiazide 20-12.5 MG per tablet  Commonly known as:  PRINZIDE,ZESTORETIC  Take 1 tablet by mouth 2 (two) times daily.     metFORMIN 1000 MG tablet  Commonly known as:  GLUCOPHAGE  Take 1,000 mg by mouth 2 (two) times daily with a  meal.     pantoprazole 20 MG tablet  Commonly known as:  PROTONIX  Take 20 mg by mouth daily.     ROBITUSSIN SUGAR FREE 10-100 MG/5ML liquid  Generic drug:  Dextromethorphan-Guaifenesin  Take 5 mLs by mouth daily as needed (For cough.).         SignedMitchel Honour 06/07/2013, 8:40 PM

## 2013-06-07 NOTE — Discharge Instructions (Signed)
Call MD for heavy vaginal bleeding, severe dizziness or light-headedness.  Call office to schedule follow-up in 1 week.

## 2013-06-07 NOTE — H&P (Signed)
Andrea Wang is an 42 y.o. female presented to the office today with complaint of prolonged vaginal bleeding x 1 1/2 weeks, heavier than normal bleeding and clotting and painful cramping abdominally and in her lower back.  Her bleeding has slowed but is still present. Previously, she had regular monthly cycles. She does have dizziness on standing and fatigue. Her day-to-day activities are affected by her symptoms. In the office, Hgb was 7.2. Patient accepts blood transfusion but declines surgical management.    Pertinent Gynecological History: Menses: regular every month without intermenstrual spotting Bleeding: dysfunctional uterine bleeding Contraception: abstinence DES exposure: unknown Blood transfusions: none Sexually transmitted diseases: no past history Previous GYN Procedures: none  Last mammogram: n/a Date: n/a Last pap: normal Date: 06/2012 OB History: G1, P1   Menstrual History: Menarche age: n/a No LMP recorded.    Past Medical History  Diagnosis Date  . IDDM (insulin dependent diabetes mellitus)   . Hyperlipidemia   . Hypertension   . GERD (gastroesophageal reflux disease)   . Bronchitis   . Asthma   . Peripheral edema   . Insomnia   . Obesity   . Diabetes mellitus     Past Surgical History  Procedure Laterality Date  . No past surgeries    . Cholecystectomy  02/03/2012    Procedure: LAPAROSCOPIC CHOLECYSTECTOMY;  Surgeon: Cherylynn Ridges, MD;  Location: [facility] OR;  Service: General;  Laterality: N/A;    Family History  Problem Relation Age of Onset  . Ovarian cancer Maternal Grandmother   . Ovarian cancer Maternal Aunt   . Heart failure Mother   . Aneurysm Father     Social History:  reports that she has never smoked. She does not have any smokeless tobacco history on file. She reports that she does not drink alcohol or use illicit drugs.  Allergies: No Known Allergies  Prescriptions prior to admission  Medication Sig Dispense Refill  . albuterol  (PROVENTIL HFA;VENTOLIN HFA) 108 (90 BASE) MCG/ACT inhaler Inhale 2 puffs into the lungs every 4 (four) hours as needed for wheezing or shortness of breath (For coughing.).      Marland Kitchen aspirin EC 81 MG tablet Take 81 mg by mouth daily.      Marland Kitchen atorvastatin (LIPITOR) 10 MG tablet Take 10 mg by mouth daily.      Marland Kitchen Dextromethorphan-Guaifenesin (ROBITUSSIN SUGAR FREE) 10-100 MG/5ML liquid Take 5 mLs by mouth daily as needed (For cough.).      Marland Kitchen Ephedrine-Guaifenesin (BRONKAID) 25-400 MG TABS Take 2 tablets by mouth at bedtime as needed (For coughing.).      Marland Kitchen insulin NPH-insulin regular (NOVOLIN 70/30) (70-30) 100 UNIT/ML injection Inject 25-40 Units into the skin 2 (two) times daily with a meal. 40 units in the morning and 25 units in the evening      . lisinopril-hydrochlorothiazide (PRINZIDE,ZESTORETIC) 20-12.5 MG per tablet Take 1 tablet by mouth 2 (two) times daily.      . metFORMIN (GLUCOPHAGE) 1000 MG tablet Take 1,000 mg by mouth 2 (two) times daily with a meal.      . pantoprazole (PROTONIX) 20 MG tablet Take 20 mg by mouth daily.        Review of Systems  Constitutional: Negative for fever and chills.  Cardiovascular: Negative for chest pain.  Gastrointestinal: Positive for abdominal pain. Negative for nausea and vomiting.  Musculoskeletal: Positive for back pain.  Neurological: Positive for headaches.    Blood pressure 124/84, pulse 105, temperature 98.4 F (36.9 C), temperature  source Oral, resp. rate 20, SpO2 98.00%. Physical Exam  Constitutional: She is oriented to person, place, and time. She appears well-developed and well-nourished.  GI: Soft. There is no rebound and no guarding.  Genitourinary: There is bleeding around the vagina.  Neurological: She is alert and oriented to person, place, and time.  Skin: Skin is warm and dry.  Psychiatric: She has a normal mood and affect. Her behavior is normal.    Results for orders placed during the hospital encounter of 06/07/13 (from the  past 24 hour(s))  CBC     Status: Abnormal   Collection Time    06/07/13  4:55 PM      Result Value Range   WBC 11.6 (*) 4.0 - 10.5 K/uL   RBC 3.60 (*) 3.87 - 5.11 MIL/uL   Hemoglobin 8.3 (*) 12.0 - 15.0 g/dL   HCT 62.9 (*) 52.8 - 41.3 %   MCV 72.5 (*) 78.0 - 100.0 fL   MCH 23.1 (*) 26.0 - 34.0 pg   MCHC 31.8  30.0 - 36.0 g/dL   RDW 24.4 (*) 01.0 - 27.2 %   Platelets 398  150 - 400 K/uL  TYPE AND SCREEN     Status: None   Collection Time    06/07/13  4:55 PM      Result Value Range   ABO/RH(D) A POS     Antibody Screen NEG     Sample Expiration 06/10/2013     Unit Number [id number]     Blood Component Type RED CELLS,LR     Unit division 00     Status of Unit ALLOCATED     Transfusion Status OK TO TRANSFUSE     Crossmatch Result Compatible     Unit Number [id number]     Blood Component Type RED CELLS,LR     Unit division 00     Status of Unit ISSUED     Transfusion Status OK TO TRANSFUSE     Crossmatch Result Compatible    ABO/RH     Status: None   Collection Time    06/07/13  4:55 PM      Result Value Range   ABO/RH(D) A POS    PREPARE RBC (CROSSMATCH)     Status: None   Collection Time    06/07/13  5:00 PM      Result Value Range   Order Confirmation ORDER PROCESSED BY BLOOD BANK    GLUCOSE, CAPILLARY     Status: Abnormal   Collection Time    06/07/13  5:26 PM      Result Value Range   Glucose-Capillary 331 (*) 70 - 99 mg/dL    No results found.  Assessment/Plan: 42yo with menometrorrhagia to anemia. -Patient counseled for blood transfusion including risk of HIV, Hep B, and C.  She understands the risk of transfusion reaction -Provera 10mg  x 10 days -FeSO4 -F/U in office   Kennet Mccort 06/07/2013, 8:16 PM

## 2013-06-08 LAB — TYPE AND SCREEN
ABO/RH(D): A POS
Unit division: 0

## 2013-06-08 NOTE — Progress Notes (Signed)
Discharged home with bleeding precautions, pt educated to return to hospital for weakness, sob, dizziness and saturating more than 2-3 pads in an hour. Pt verbalized understanding and plans to call Dr Langston Masker' office in the am.

## 2014-01-12 ENCOUNTER — Emergency Department (HOSPITAL_BASED_OUTPATIENT_CLINIC_OR_DEPARTMENT_OTHER)
Admission: EM | Admit: 2014-01-12 | Discharge: 2014-01-12 | Disposition: A | Discharge status: Home / Self Care | Payer: Managed Care, Other (non HMO) | Attending: Emergency Medicine | Admitting: Emergency Medicine

## 2014-01-12 ENCOUNTER — Encounter (HOSPITAL_BASED_OUTPATIENT_CLINIC_OR_DEPARTMENT_OTHER): Payer: Self-pay | Admitting: Emergency Medicine

## 2014-01-12 DIAGNOSIS — Z79899 Other long term (current) drug therapy: Secondary | ICD-10-CM | POA: Insufficient documentation

## 2014-01-12 DIAGNOSIS — J45909 Unspecified asthma, uncomplicated: Secondary | ICD-10-CM | POA: Insufficient documentation

## 2014-01-12 DIAGNOSIS — Z5189 Encounter for other specified aftercare: Secondary | ICD-10-CM

## 2014-01-12 DIAGNOSIS — Z7982 Long term (current) use of aspirin: Secondary | ICD-10-CM | POA: Insufficient documentation

## 2014-01-12 DIAGNOSIS — E669 Obesity, unspecified: Secondary | ICD-10-CM | POA: Insufficient documentation

## 2014-01-12 DIAGNOSIS — K219 Gastro-esophageal reflux disease without esophagitis: Secondary | ICD-10-CM | POA: Insufficient documentation

## 2014-01-12 DIAGNOSIS — E785 Hyperlipidemia, unspecified: Secondary | ICD-10-CM | POA: Insufficient documentation

## 2014-01-12 DIAGNOSIS — Z794 Long term (current) use of insulin: Secondary | ICD-10-CM | POA: Insufficient documentation

## 2014-01-12 DIAGNOSIS — I10 Essential (primary) hypertension: Secondary | ICD-10-CM | POA: Insufficient documentation

## 2014-01-12 DIAGNOSIS — E119 Type 2 diabetes mellitus without complications: Secondary | ICD-10-CM | POA: Insufficient documentation

## 2014-01-12 DIAGNOSIS — IMO0002 Reserved for concepts with insufficient information to code with codable children: Secondary | ICD-10-CM | POA: Insufficient documentation

## 2014-01-12 NOTE — ED Notes (Signed)
Pt sent to ED by pmd for an abscess in left axilla that is not healing after tx and is turning black around the edge.  Pt sts he doctor told her it could turn to gangrene and she needs to come to the ED since she is diabetic.

## 2014-01-12 NOTE — ED Provider Notes (Signed)
CSN: [id number]     Arrival date & time 01/12/14  1717 History   First MD Initiated Contact with Patient 01/12/14 1909     This chart was scribed for Andrea Dessert, MD by Forrestine Him, ED Scribe. This patient was seen in room MH10/MH10 and the patient's care was started 7:28 PM.   Chief Complaint  Patient presents with  . Abscess    The history is provided by the patient. No language interpreter was used.    HPI Comments: Andrea Wang is a 43 y.o. female with a PMHx of hyperlipidemia, HTN, GERD, DM, and asthma who presents to the Emergency Department complaining of a recurrent abscess to the L axilla x 2 weeks that is unchanged. She also reports mild pain to the area. Pt was sent to ED per pmd as he stated abscess was not healing after previous treatment. States she did not undergo any I&D procedure for this recurrent abscess. She states area has turned black around the surrounding edges. She denies any drainage. At this time she denies any fever or chills. No other concerns this visit.  Past Medical History  Diagnosis Date  . IDDM (insulin dependent diabetes mellitus)   . Hyperlipidemia   . Hypertension   . GERD (gastroesophageal reflux disease)   . Bronchitis   . Asthma   . Peripheral edema   . Insomnia   . Obesity   . Diabetes mellitus    Past Surgical History  Procedure Laterality Date  . No past surgeries    . Cholecystectomy  02/03/2012    Procedure: LAPAROSCOPIC CHOLECYSTECTOMY;  Surgeon: Gwenyth Ober, MD;  Location: [facility] OR;  Service: General;  Laterality: N/A;   Family History  Problem Relation Age of Onset  . Ovarian cancer Maternal Grandmother   . Ovarian cancer Maternal Aunt   . Heart failure Mother   . Aneurysm Father    History  Substance Use Topics  . Smoking status: Never Smoker   . Smokeless tobacco: Not on file  . Alcohol Use: No   OB History   Grav Para Term Preterm Abortions TAB SAB Ect Mult Living                 Review of Systems  A  complete 10 system review of systems was obtained and all systems are negative except as noted in the HPI and PMH.    Allergies  Review of patient's allergies indicates no known allergies.  Home Medications   Prior to Admission medications   Medication Sig Start Date End Date Taking? Authorizing Provider  losartan (COZAAR) 50 MG tablet Take 50 mg by mouth daily.   Yes Historical Provider, MD  albuterol (PROVENTIL HFA;VENTOLIN HFA) 108 (90 BASE) MCG/ACT inhaler Inhale 2 puffs into the lungs every 4 (four) hours as needed for wheezing or shortness of breath (For coughing.).    Historical Provider, MD  aspirin EC 81 MG tablet Take 81 mg by mouth daily.    Historical Provider, MD  atorvastatin (LIPITOR) 10 MG tablet Take 10 mg by mouth daily.    Historical Provider, MD  Dextromethorphan-Guaifenesin (ROBITUSSIN SUGAR FREE) 10-100 MG/5ML liquid Take 5 mLs by mouth daily as needed (For cough.).    Historical Provider, MD  Ephedrine-Guaifenesin (BRONKAID) 25-400 MG TABS Take 2 tablets by mouth at bedtime as needed (For coughing.).    Historical Provider, MD  ferrous gluconate (FERGON) 324 MG tablet Take 1 tablet (324 mg total) by mouth 3 (three) times daily with  meals. 06/07/13   Megan Morris, DO  insulin NPH-insulin regular (NOVOLIN 70/30) (70-30) 100 UNIT/ML injection Inject 25-40 Units into the skin 2 (two) times daily with a meal. 40 units in the morning and 25 units in the evening    Historical Provider, MD  lisinopril-hydrochlorothiazide (PRINZIDE,ZESTORETIC) 20-12.5 MG per tablet Take 1 tablet by mouth 2 (two) times daily.    Historical Provider, MD  medroxyPROGESTERone (PROVERA) 10 MG tablet Take 1 tablet (10 mg total) by mouth daily. 06/07/13 06/16/13  Megan Morris, DO  metFORMIN (GLUCOPHAGE) 1000 MG tablet Take 1,000 mg by mouth 2 (two) times daily with a meal.    Historical Provider, MD  pantoprazole (PROTONIX) 20 MG tablet Take 20 mg by mouth daily.    Historical Provider, MD   Triage  Vitals: BP 186/89  Pulse 101  Temp(Src) 99 F (37.2 C) (Oral)  Resp 18  Ht 5\' 7"  (1.702 m)  Wt 309 lb (140.161 kg)  BMI 48.38 kg/m2  SpO2 100%  LMP 12/12/2013   Physical Exam  Nursing note and vitals reviewed. Constitutional: She is oriented to person, place, and time. She appears well-developed and well-nourished.  HENT:  Head: Normocephalic.  Eyes: EOM are normal.  Neck: Normal range of motion.  Pulmonary/Chest: Effort normal.  Abdominal: She exhibits no distension.  Musculoskeletal: Normal range of motion.  Neurological: She is alert and oriented to person, place, and time.  Skin:  Well healing prior abscess to L axilla No induration No fluctuance  No erythema No drainage  Psychiatric: She has a normal mood and affect.    ED Course  Procedures (including critical care time)  DIAGNOSTIC STUDIES: Oxygen Saturation is 100% on RA, Normal by my interpretation.    COORDINATION OF CARE: 7:35 PM-Discussed treatment plan with pt at bedside and pt agreed to plan.     Labs Review Labs Reviewed - No data to display  Imaging Review No results found.   EKG Interpretation None      MDM   Final diagnoses:  Wound check, abscess    Patient here for evaluation of an axillary abscess. She has a history of hidradenitis based on history. She denies any pain or drainage at this time. Upon evaluation of the axilla there is some dark scar tissue but no signs of new abscess or worsening infection. At this time there is no drainage or antibiotics required. Findings discussed with the patient she was discharged home  I personally performed the services described in this documentation, which was scribed in my presence. The recorded information has been reviewed and is accurate.    Andrea Dessert, MD 01/12/14 2248

## 2015-02-03 ENCOUNTER — Encounter (HOSPITAL_BASED_OUTPATIENT_CLINIC_OR_DEPARTMENT_OTHER): Payer: Self-pay | Admitting: *Deleted

## 2015-02-03 ENCOUNTER — Observation Stay (HOSPITAL_COMMUNITY)
Admission: AD | Admit: 2015-02-03 | Discharge: 2015-02-03 | Disposition: A | Discharge status: Home / Self Care | Payer: Managed Care, Other (non HMO) | Source: Ambulatory Visit | Attending: Obstetrics & Gynecology | Admitting: Obstetrics & Gynecology

## 2015-02-03 ENCOUNTER — Emergency Department (HOSPITAL_COMMUNITY)
Admission: AD | Admit: 2015-02-03 | Discharge: 2015-02-03 | Disposition: A | Discharge status: Home / Self Care | Payer: Managed Care, Other (non HMO) | Source: Ambulatory Visit | Attending: Obstetrics & Gynecology | Admitting: Obstetrics & Gynecology

## 2015-02-03 ENCOUNTER — Encounter (HOSPITAL_COMMUNITY): Payer: Self-pay | Admitting: *Deleted

## 2015-02-03 DIAGNOSIS — G4700 Insomnia, unspecified: Secondary | ICD-10-CM | POA: Diagnosis not present

## 2015-02-03 DIAGNOSIS — E785 Hyperlipidemia, unspecified: Secondary | ICD-10-CM | POA: Insufficient documentation

## 2015-02-03 DIAGNOSIS — E119 Type 2 diabetes mellitus without complications: Secondary | ICD-10-CM | POA: Insufficient documentation

## 2015-02-03 DIAGNOSIS — Z79899 Other long term (current) drug therapy: Secondary | ICD-10-CM | POA: Insufficient documentation

## 2015-02-03 DIAGNOSIS — J45909 Unspecified asthma, uncomplicated: Secondary | ICD-10-CM | POA: Insufficient documentation

## 2015-02-03 DIAGNOSIS — Z7982 Long term (current) use of aspirin: Secondary | ICD-10-CM | POA: Diagnosis not present

## 2015-02-03 DIAGNOSIS — E669 Obesity, unspecified: Secondary | ICD-10-CM | POA: Insufficient documentation

## 2015-02-03 DIAGNOSIS — D649 Anemia, unspecified: Secondary | ICD-10-CM | POA: Diagnosis present

## 2015-02-03 DIAGNOSIS — K219 Gastro-esophageal reflux disease without esophagitis: Secondary | ICD-10-CM | POA: Insufficient documentation

## 2015-02-03 DIAGNOSIS — Z794 Long term (current) use of insulin: Secondary | ICD-10-CM | POA: Insufficient documentation

## 2015-02-03 DIAGNOSIS — Z86018 Personal history of other benign neoplasm: Secondary | ICD-10-CM | POA: Insufficient documentation

## 2015-02-03 DIAGNOSIS — D259 Leiomyoma of uterus, unspecified: Secondary | ICD-10-CM

## 2015-02-03 DIAGNOSIS — I10 Essential (primary) hypertension: Secondary | ICD-10-CM | POA: Insufficient documentation

## 2015-02-03 DIAGNOSIS — D62 Acute posthemorrhagic anemia: Secondary | ICD-10-CM

## 2015-02-03 DIAGNOSIS — R42 Dizziness and giddiness: Secondary | ICD-10-CM | POA: Insufficient documentation

## 2015-02-03 DIAGNOSIS — Z8669 Personal history of other diseases of the nervous system and sense organs: Secondary | ICD-10-CM | POA: Insufficient documentation

## 2015-02-03 DIAGNOSIS — N939 Abnormal uterine and vaginal bleeding, unspecified: Secondary | ICD-10-CM | POA: Insufficient documentation

## 2015-02-03 DIAGNOSIS — R000 Tachycardia, unspecified: Secondary | ICD-10-CM | POA: Insufficient documentation

## 2015-02-03 LAB — CBC
HCT: 22.1 % — ABNORMAL LOW (ref 36.0–46.0)
Hemoglobin: 7 g/dL — ABNORMAL LOW (ref 12.0–15.0)
MCH: 21.3 pg — AB (ref 26.0–34.0)
MCHC: 31.7 g/dL (ref 30.0–36.0)
MCV: 67.4 fL — AB (ref 78.0–100.0)
PLATELETS: 430 10*3/uL — AB (ref 150–400)
RBC: 3.28 MIL/uL — ABNORMAL LOW (ref 3.87–5.11)
RDW: 17.3 % — AB (ref 11.5–15.5)
WBC: 11.1 10*3/uL — AB (ref 4.0–10.5)

## 2015-02-03 LAB — CBC WITH DIFFERENTIAL/PLATELET
Basophils Absolute: 0.1 10*3/uL (ref 0.0–0.1)
Basophils Relative: 1 % (ref 0–1)
Eosinophils Absolute: 0.5 10*3/uL (ref 0.0–0.7)
Eosinophils Relative: 4 % (ref 0–5)
HCT: 23.7 % — ABNORMAL LOW (ref 36.0–46.0)
Hemoglobin: 7.3 g/dL — ABNORMAL LOW (ref 12.0–15.0)
LYMPHS ABS: 3.7 10*3/uL (ref 0.7–4.0)
Lymphocytes Relative: 30 % (ref 12–46)
MCH: 21.2 pg — ABNORMAL LOW (ref 26.0–34.0)
MCHC: 30.8 g/dL (ref 30.0–36.0)
MCV: 68.9 fL — AB (ref 78.0–100.0)
Monocytes Absolute: 1.2 10*3/uL — ABNORMAL HIGH (ref 0.1–1.0)
Monocytes Relative: 10 % (ref 3–12)
NEUTROS ABS: 6.8 10*3/uL (ref 1.7–7.7)
Neutrophils Relative %: 55 % (ref 43–77)
PLATELETS: 460 10*3/uL — AB (ref 150–400)
RBC: 3.44 MIL/uL — ABNORMAL LOW (ref 3.87–5.11)
RDW: 17 % — AB (ref 11.5–15.5)
WBC: 12.3 10*3/uL — AB (ref 4.0–10.5)

## 2015-02-03 LAB — PREPARE RBC (CROSSMATCH)

## 2015-02-03 MED ORDER — SODIUM CHLORIDE 0.9 % IV SOLN
Freq: Once | INTRAVENOUS | Status: AC
  Administered 2015-02-03: 16:00:00 via INTRAVENOUS

## 2015-02-03 MED ORDER — SODIUM CHLORIDE 0.9 % IV SOLN: Freq: Once | INTRAVENOUS | Status: DC

## 2015-02-03 MED ORDER — MEDROXYPROGESTERONE ACETATE 10 MG PO TABS
10.0000 mg | ORAL_TABLET | Freq: Every day | ORAL | Status: DC
  Administered 2015-02-03: 10 mg via ORAL
  Filled 2015-02-03 (×2): qty 1

## 2015-02-03 MED ORDER — PRENATAL MULTIVITAMIN CH
1.0000 | ORAL_TABLET | Freq: Every day | ORAL | Status: DC
  Filled 2015-02-03 (×2): qty 1

## 2015-02-03 MED ORDER — DIPHENHYDRAMINE HCL 25 MG PO CAPS
25.0000 mg | ORAL_CAPSULE | Freq: Once | ORAL | Status: AC
  Administered 2015-02-03: 25 mg via ORAL
  Filled 2015-02-03: qty 1

## 2015-02-03 MED ORDER — ACETAMINOPHEN 325 MG PO TABS
650.0000 mg | ORAL_TABLET | Freq: Once | ORAL | Status: AC
  Administered 2015-02-03: 650 mg via ORAL
  Filled 2015-02-03: qty 2

## 2015-02-03 MED ORDER — MEDROXYPROGESTERONE ACETATE 10 MG PO TABS: 10.0000 mg | ORAL_TABLET | Freq: Every day | ORAL | Status: AC

## 2015-02-03 NOTE — Progress Notes (Signed)
Pt ambulated off unit for discharge with no further concerns.

## 2015-02-03 NOTE — MAU Note (Signed)
Bleeding for 2 wks.  Hx of fibroids.  Hx of blood transfusions.  Was seen at [facility] today.  Here for blood transfusion.

## 2015-02-03 NOTE — ED Notes (Signed)
Pt talking with md, md assisted with pelvic exam during pt triage. Pt reports 2 weeks of heavy vag bleeding with clots, small amt blood noted on pelvic exam. Pt reports that she feels fatigued, no energy, "tired".

## 2015-02-03 NOTE — H&P (Signed)
Andrea Wang is an 44 y.o. female with fibroid uterus, heavy menstrual bleeding previously requiring blood transfusion.  She was seen in the office 2 years ago for the same problem but was lost to follow-up.  She reports no heavy bleeding until recently when she started bleeding heavily with clotting around 2 weeks ago.  She reports no pain currently.  She went to the ED today and was found to have Hgb 7 but no blood products were available at the facility.  SSE with scant vaginal bleeding per ED MD.  She was transferred to [facility] for blood transfusion.  Patient reports the pain has greatly decreased.  She is only bothered by light-headedness and dizziness.  No chest pain or shortness of breath.  Pertinent Gynecological History: Menses: flow is excessive with use of 8 pads or tampons on heaviest days Bleeding: dysfunctional uterine bleeding Contraception: none DES exposure: unknown Blood transfusions: history of Sexually transmitted diseases: no past history Previous GYN Procedures: non  Last mammogram: n/a Date: n/a Last pap: n/a Date: n/a  Menstrual History: Menarche age: n/a Patient's last menstrual period was 01/20/2015.    Past Medical History  Diagnosis Date  . IDDM (insulin dependent diabetes mellitus)   . Hyperlipidemia   . Hypertension   . GERD (gastroesophageal reflux disease)   . Bronchitis   . Asthma   . Peripheral edema   . Insomnia   . Obesity   . Diabetes mellitus     Past Surgical History  Procedure Laterality Date  . No past surgeries    . Cholecystectomy  02/03/2012    Procedure: LAPAROSCOPIC CHOLECYSTECTOMY;  Surgeon: Gwenyth Ober, MD;  Location: [facility] OR;  Service: General;  Laterality: N/A;    Family History  Problem Relation Age of Onset  . Ovarian cancer Maternal Grandmother   . Ovarian cancer Maternal Aunt   . Heart failure Mother   . Aneurysm Father     Social History:  reports that she has never smoked. She does not have any smokeless tobacco history  on file. She reports that she does not drink alcohol or use illicit drugs.  Allergies:  Allergies  Allergen Reactions  . Sulfa Antibiotics Hives    Prescriptions prior to admission  Medication Sig Dispense Refill Last Dose  . Albiglutide (TANZEUM) 30 MG PEN Inject 30 mg into the skin every 7 (seven) days. On Sundays   01/29/2015  . aspirin EC 81 MG tablet Take 81 mg by mouth daily.   02/02/2015 at Unknown time  . atorvastatin (LIPITOR) 10 MG tablet Take 10 mg by mouth daily.   02/02/2015 at Unknown time  . Cholecalciferol (VITAMIN D3) 5000 UNITS CAPS Take 5,000 Units by mouth every 7 (seven) days. On Sundays   01/29/2015 at Unknown time  . cyanocobalamin 500 MCG tablet Take 500 mcg by mouth daily.   02/02/2015 at Unknown time  . ferrous gluconate (FERGON) 324 MG tablet Take 1 tablet (324 mg total) by mouth 3 (three) times daily with meals. (Patient taking differently: Take 324 mg by mouth daily with breakfast. ) 90 tablet 3 02/02/2015 at Unknown time  . insulin NPH-insulin regular (NOVOLIN 70/30) (70-30) 100 UNIT/ML injection Inject 25-40 Units into the skin 2 (two) times daily with a meal. 40 units in the morning and 25 units in the evening   02/02/2015 at Unknown time  . losartan-hydrochlorothiazide (HYZAAR) 100-25 MG per tablet Take 1 tablet by mouth daily.   02/02/2015 at Unknown time  . metFORMIN (GLUCOPHAGE)  1000 MG tablet Take 1,000 mg by mouth 2 (two) times daily with a meal.   02/02/2015 at Unknown time  . pantoprazole (PROTONIX) 40 MG tablet Take 40 mg by mouth daily.   02/02/2015 at Unknown time  . albuterol (PROVENTIL HFA;VENTOLIN HFA) 108 (90 BASE) MCG/ACT inhaler Inhale 2 puffs into the lungs every 4 (four) hours as needed for wheezing or shortness of breath (For coughing.).   rescue    Review of Systems  Respiratory: Negative for shortness of breath.   Cardiovascular: Negative for chest pain.  Gastrointestinal: Negative for abdominal pain.  Neurological: Positive for dizziness.  Negative for headaches.    Blood pressure 120/52, pulse 91, temperature 97.7 F (36.5 C), temperature source Oral, resp. rate 20, last menstrual period 01/20/2015, SpO2 100 %. Physical Exam  Constitutional: She is oriented to person, place, and time. She appears well-developed and well-nourished.  GI: Soft. There is no rebound and no guarding.  Genitourinary: Vagina normal.  Neurological: She is alert and oriented to person, place, and time.  Skin: Skin is warm and dry.  Psychiatric: She has a normal mood and affect. Her behavior is normal.    Results for orders placed or performed during the hospital encounter of 02/03/15 (from the past 24 hour(s))  Prepare RBC     Status: None   Collection Time: 02/03/15  1:08 PM  Result Value Ref Range   Order Confirmation ORDER PROCESSED BY BLOOD BANK   Type and screen for Red Blood Exchange     Status: None (Preliminary result)   Collection Time: 02/03/15  1:50 PM  Result Value Ref Range   ABO/RH(D) A POS    Antibody Screen NEG    Sample Expiration 02/06/2015    Unit Number [id number]    Blood Component Type RED CELLS,LR    Unit division 00    Status of Unit ISSUED    Transfusion Status OK TO TRANSFUSE    Crossmatch Result Compatible    Unit Number [id number]    Blood Component Type RED CELLS,LR    Unit division 00    Status of Unit ALLOCATED    Transfusion Status OK TO TRANSFUSE    Crossmatch Result Compatible   CBC     Status: Abnormal   Collection Time: 02/03/15  1:50 PM  Result Value Ref Range   WBC 11.1 (H) 4.0 - 10.5 K/uL   RBC 3.28 (L) 3.87 - 5.11 MIL/uL   Hemoglobin 7.0 (L) 12.0 - 15.0 g/dL   HCT 22.1 (L) 36.0 - 46.0 %   MCV 67.4 (L) 78.0 - 100.0 fL   MCH 21.3 (L) 26.0 - 34.0 pg   MCHC 31.7 30.0 - 36.0 g/dL   RDW 17.3 (H) 11.5 - 15.5 %   Platelets 430 (H) 150 - 400 K/uL    No results found.  Assessment/Plan: 44yo with fibroids, heavy menstrual bleeding, anemia -transfuse 2 u prbcs -Plan cyclic provera -Plan  surgical management; not emergent/during this admission  Andrea Wang 02/03/2015, 5:53 PM

## 2015-02-03 NOTE — Progress Notes (Signed)
Pt given discharge instructions with no further concerns.

## 2015-02-03 NOTE — Discharge Summary (Signed)
Physician Discharge Summary  Patient ID: Andrea Wang MRN: [id number] DOB/AGE: 09-13-1970 44 y.o.  Admit date: 02/03/2015 Discharge date: 02/03/2015  Admission Diagnoses: Heavy menstrual bleeding, fibroids, anemia  Discharge Diagnoses: SAA Active Problems:   Anemia   Discharged Condition: good  Hospital Course: Patient was admitted to receive blood transfusion secondary to symptomatic anemia secondary to heavy menstrual bleeding.  She received blood transfusion without complication and was ready for discharge.  Consults: None  Significant Diagnostic Studies: none  Treatments: 2 u PRBCs  Discharge Exam: Blood pressure 125/71, pulse 82, temperature 97.9 F (36.6 C), temperature source Oral, resp. rate 20, last menstrual period 01/20/2015, SpO2 100 %. General appearance: alert, cooperative and appears stated age  Disposition: 02-Short Term Hospital     Medication List    TAKE these medications        albuterol 108 (90 BASE) MCG/ACT inhaler  Commonly known as:  PROVENTIL HFA;VENTOLIN HFA  Inhale 2 puffs into the lungs every 4 (four) hours as needed for wheezing or shortness of breath (For coughing.).     aspirin EC 81 MG tablet  Take 81 mg by mouth daily.     atorvastatin 10 MG tablet  Commonly known as:  LIPITOR  Take 10 mg by mouth daily.     cyanocobalamin 500 MCG tablet  Take 500 mcg by mouth daily.     ferrous gluconate 324 MG tablet  Commonly known as:  FERGON  Take 1 tablet (324 mg total) by mouth 3 (three) times daily with meals.     insulin NPH-regular Human (70-30) 100 UNIT/ML injection  Commonly known as:  NOVOLIN 70/30  Inject 25-40 Units into the skin 2 (two) times daily with a meal. 40 units in the morning and 25 units in the evening     losartan-hydrochlorothiazide 100-25 MG per tablet  Commonly known as:  HYZAAR  Take 1 tablet by mouth daily.     medroxyPROGESTERone 10 MG tablet  Commonly known as:  PROVERA  Take 1 tablet (10 mg total) by  mouth daily.     metFORMIN 1000 MG tablet  Commonly known as:  GLUCOPHAGE  Take 1,000 mg by mouth 2 (two) times daily with a meal.     pantoprazole 40 MG tablet  Commonly known as:  PROTONIX  Take 40 mg by mouth daily.     TANZEUM 30 MG Pen  Generic drug:  Albiglutide  Inject 30 mg into the skin every 7 (seven) days. On Sundays     Vitamin D3 5000 UNITS Caps  Take 5,000 Units by mouth every 7 (seven) days. On Sundays         Signed: Linda Hedges 02/03/2015, 9:47 PM

## 2015-02-03 NOTE — Discharge Instructions (Signed)
Call MD for heavy vaginal bleeding, dizziness/light-headedness.  Call office to schedule appointment next week.  Take provera daily.

## 2015-02-03 NOTE — ED Provider Notes (Addendum)
CSN: [id number]     Arrival date & time 02/03/15  5397 History   First MD Initiated Contact with Patient 02/03/15 [id number]     Chief Complaint  Patient presents with  . Vaginal Bleeding     (Consider location/radiation/quality/duration/timing/severity/associated sxs/prior Treatment) Patient is a 44 y.o. female presenting with vaginal bleeding. The history is provided by the patient.  Vaginal Bleeding Quality:  Clots, dark red and heavier than menses Severity:  Severe Onset quality:  Gradual Duration:  2 weeks Timing:  Constant Progression:  Waxing and waning Chronicity:  Recurrent Menstrual history:  Regular Number of pads used:  Going through extra absorbant pads 3 times an hour the first few days when the bleeding started.  4 days ago bleeding slowed to spotting but then started passing clots again 2 days ago Possible pregnancy: no   Context: spontaneously   Relieved by:  Nothing Worsened by:  Activity Ineffective treatments:  None tried Associated symptoms: abdominal pain and fatigue   Associated symptoms: no dysuria, no fever and no vaginal discharge   Associated symptoms comment:  Feels generally weak and no energy Risk factors comment:  Prior hx of bleeding fibroids requring blood transfusion about 3 year ago.  still takes iron pills   Past Medical History  Diagnosis Date  . IDDM (insulin dependent diabetes mellitus)   . Hyperlipidemia   . Hypertension   . GERD (gastroesophageal reflux disease)   . Bronchitis   . Asthma   . Peripheral edema   . Insomnia   . Obesity   . Diabetes mellitus    Past Surgical History  Procedure Laterality Date  . No past surgeries    . Cholecystectomy  02/03/2012    Procedure: LAPAROSCOPIC CHOLECYSTECTOMY;  Surgeon: Gwenyth Ober, MD;  Location: [facility] OR;  Service: General;  Laterality: N/A;   Family History  Problem Relation Age of Onset  . Ovarian cancer Maternal Grandmother   . Ovarian cancer Maternal Aunt   . Heart failure Mother    . Aneurysm Father    History  Substance Use Topics  . Smoking status: Never Smoker   . Smokeless tobacco: Not on file  . Alcohol Use: No   OB History    No data available     Review of Systems  Constitutional: Positive for fatigue. Negative for fever.  Gastrointestinal: Positive for abdominal pain.  Genitourinary: Positive for vaginal bleeding. Negative for dysuria and vaginal discharge.  All other systems reviewed and are negative.     Allergies  Review of patient's allergies indicates no known allergies.  Home Medications   Prior to Admission medications   Medication Sig Start Date End Date Taking? Authorizing Provider  albuterol (PROVENTIL HFA;VENTOLIN HFA) 108 (90 BASE) MCG/ACT inhaler Inhale 2 puffs into the lungs every 4 (four) hours as needed for wheezing or shortness of breath (For coughing.).    Historical Provider, MD  aspirin EC 81 MG tablet Take 81 mg by mouth daily.    Historical Provider, MD  atorvastatin (LIPITOR) 10 MG tablet Take 10 mg by mouth daily.    Historical Provider, MD  Dextromethorphan-Guaifenesin (ROBITUSSIN SUGAR FREE) 10-100 MG/5ML liquid Take 5 mLs by mouth daily as needed (For cough.).    Historical Provider, MD  Ephedrine-Guaifenesin (BRONKAID) 25-400 MG TABS Take 2 tablets by mouth at bedtime as needed (For coughing.).    Historical Provider, MD  ferrous gluconate (FERGON) 324 MG tablet Take 1 tablet (324 mg total) by mouth 3 (three) times daily with meals.  06/07/13   Megan Morris, DO  insulin NPH-insulin regular (NOVOLIN 70/30) (70-30) 100 UNIT/ML injection Inject 25-40 Units into the skin 2 (two) times daily with a meal. 40 units in the morning and 25 units in the evening    Historical Provider, MD  lisinopril-hydrochlorothiazide (PRINZIDE,ZESTORETIC) 20-12.5 MG per tablet Take 1 tablet by mouth 2 (two) times daily.    Historical Provider, MD  losartan (COZAAR) 50 MG tablet Take 50 mg by mouth daily.    Historical Provider, MD  metFORMIN  (GLUCOPHAGE) 1000 MG tablet Take 1,000 mg by mouth 2 (two) times daily with a meal.    Historical Provider, MD  pantoprazole (PROTONIX) 20 MG tablet Take 20 mg by mouth daily.    Historical Provider, MD   BP 110/64 mmHg  Pulse 118  Temp(Src) 98.2 F (36.8 C) (Oral)  Resp 18  Ht 5\' 7"  (1.702 m)  Wt 310 lb (140.615 kg)  BMI 48.54 kg/m2  SpO2 99%  LMP 01/20/2015 Physical Exam  Constitutional: She is oriented to person, place, and time. She appears well-developed and well-nourished. No distress.  HENT:  Head: Normocephalic and atraumatic.  Mouth/Throat: Oropharynx is clear and moist.  Eyes: Conjunctivae and EOM are normal. Pupils are equal, round, and reactive to light.  Neck: Normal range of motion. Neck supple.  Cardiovascular: Regular rhythm and intact distal pulses.  Tachycardia present.   No murmur heard. Pulmonary/Chest: Effort normal and breath sounds normal. No respiratory distress. She has no wheezes. She has no rales.  Abdominal: Soft. She exhibits no distension. There is no tenderness. There is no rebound and no guarding.  Genitourinary: Uterus normal. Cervix exhibits no motion tenderness, no discharge and no friability. Right adnexum displays no tenderness. Left adnexum displays no tenderness. There is bleeding in the vagina.  Small amount of dark blood in the vaginal vault.  Unable to express any blood from the cervix  Musculoskeletal: Normal range of motion. She exhibits no edema or tenderness.  Neurological: She is alert and oriented to person, place, and time.  Skin: Skin is warm and dry. No rash noted. No erythema.  Psychiatric: She has a normal mood and affect. Her behavior is normal.  Nursing note and vitals reviewed.   ED Course  Procedures (including critical care time) Labs Review Labs Reviewed  CBC WITH DIFFERENTIAL/PLATELET - Abnormal; Notable for the following:    WBC 12.3 (*)    RBC 3.44 (*)    Hemoglobin 7.3 (*)    HCT 23.7 (*)    MCV 68.9 (*)    MCH  21.2 (*)    RDW 17.0 (*)    Platelets 460 (*)    Monocytes Absolute 1.2 (*)    All other components within normal limits    Imaging Review No results found.   EKG Interpretation None      MDM   Final diagnoses:  Acute blood loss anemia  Uterine leiomyoma, unspecified location  Vaginal bleeding    Patient with a significant history for fibroids with prior blood transfusion from excessive bleeding a proximally 3 years ago who still currently takes iron and presents with 2 week history of vaginal bleeding. The first week at the vaginal bleeding was very heavy with excessive clotting and going through numerous pads in an hour. 4 days ago the bleeding improved to just spotting but then returned yesterday and patient is now passing large clots again. Also over the last 2 days she's felt extremely tired with no energy. She denies any  chest pain or shortness of breath. On exam patient has minimal bleeding present. Only dark blood in the vaginal vault without any clots present.  Patient takes no blood thinners other than 81 mg of aspirin and has no bleeding problems. CBC pending hemodynamically stable at this time.  10:40 AM Hb has decreased to 7.3.  Spoke with Dr. Linda Hedges today who will accept the pt at [facility] hospital for blood transfusion.  Discussed with [facility] and pt was able to drive here and feel reasonably stable to go to [facility] by POV.  They are expecting her.  Blanchie Dessert, MD 02/03/15 1041  Blanchie Dessert, MD 02/03/15 1042  Blanchie Dessert, MD 02/03/15 1043  Blanchie Dessert, MD 02/03/15 1237

## 2015-02-04 LAB — TYPE AND SCREEN
ABO/RH(D): A POS
ANTIBODY SCREEN: NEGATIVE
UNIT DIVISION: 0
Unit division: 0

## 2015-10-31 ENCOUNTER — Emergency Department (HOSPITAL_BASED_OUTPATIENT_CLINIC_OR_DEPARTMENT_OTHER): Payer: Managed Care, Other (non HMO)

## 2015-10-31 ENCOUNTER — Emergency Department (HOSPITAL_BASED_OUTPATIENT_CLINIC_OR_DEPARTMENT_OTHER)
Admission: EM | Admit: 2015-10-31 | Discharge: 2015-11-01 | Disposition: A | Discharge status: Home / Self Care | Payer: Managed Care, Other (non HMO) | Attending: Emergency Medicine | Admitting: Emergency Medicine

## 2015-10-31 ENCOUNTER — Other Ambulatory Visit: Payer: Self-pay

## 2015-10-31 ENCOUNTER — Encounter (HOSPITAL_BASED_OUTPATIENT_CLINIC_OR_DEPARTMENT_OTHER): Payer: Self-pay | Admitting: *Deleted

## 2015-10-31 DIAGNOSIS — I10 Essential (primary) hypertension: Secondary | ICD-10-CM | POA: Insufficient documentation

## 2015-10-31 DIAGNOSIS — J111 Influenza due to unidentified influenza virus with other respiratory manifestations: Secondary | ICD-10-CM

## 2015-10-31 DIAGNOSIS — R05 Cough: Secondary | ICD-10-CM | POA: Diagnosis present

## 2015-10-31 DIAGNOSIS — Z7984 Long term (current) use of oral hypoglycemic drugs: Secondary | ICD-10-CM | POA: Diagnosis not present

## 2015-10-31 DIAGNOSIS — J45909 Unspecified asthma, uncomplicated: Secondary | ICD-10-CM | POA: Insufficient documentation

## 2015-10-31 DIAGNOSIS — Z7982 Long term (current) use of aspirin: Secondary | ICD-10-CM | POA: Diagnosis not present

## 2015-10-31 DIAGNOSIS — Z79899 Other long term (current) drug therapy: Secondary | ICD-10-CM | POA: Diagnosis not present

## 2015-10-31 DIAGNOSIS — K219 Gastro-esophageal reflux disease without esophagitis: Secondary | ICD-10-CM | POA: Insufficient documentation

## 2015-10-31 DIAGNOSIS — R69 Illness, unspecified: Secondary | ICD-10-CM

## 2015-10-31 DIAGNOSIS — E119 Type 2 diabetes mellitus without complications: Secondary | ICD-10-CM | POA: Insufficient documentation

## 2015-10-31 DIAGNOSIS — E785 Hyperlipidemia, unspecified: Secondary | ICD-10-CM | POA: Diagnosis not present

## 2015-10-31 DIAGNOSIS — Z8669 Personal history of other diseases of the nervous system and sense organs: Secondary | ICD-10-CM | POA: Diagnosis not present

## 2015-10-31 DIAGNOSIS — Z794 Long term (current) use of insulin: Secondary | ICD-10-CM | POA: Diagnosis not present

## 2015-10-31 DIAGNOSIS — E669 Obesity, unspecified: Secondary | ICD-10-CM | POA: Insufficient documentation

## 2015-10-31 LAB — D-DIMER, QUANTITATIVE: D-Dimer, Quant: 0.34 ug/mL-FEU (ref 0.00–0.50)

## 2015-10-31 LAB — CBC WITH DIFFERENTIAL/PLATELET
Basophils Absolute: 0.1 10*3/uL (ref 0.0–0.1)
Basophils Relative: 1 %
Eosinophils Absolute: 0.1 10*3/uL (ref 0.0–0.7)
Eosinophils Relative: 1 %
HCT: 34 % — ABNORMAL LOW (ref 36.0–46.0)
Hemoglobin: 10.6 g/dL — ABNORMAL LOW (ref 12.0–15.0)
Lymphocytes Relative: 15 %
Lymphs Abs: 1.2 10*3/uL (ref 0.7–4.0)
MCH: 23.3 pg — ABNORMAL LOW (ref 26.0–34.0)
MCHC: 31.2 g/dL (ref 30.0–36.0)
MCV: 74.7 fL — ABNORMAL LOW (ref 78.0–100.0)
Monocytes Absolute: 0.9 10*3/uL (ref 0.1–1.0)
Monocytes Relative: 11 %
Neutro Abs: 5.7 10*3/uL (ref 1.7–7.7)
Neutrophils Relative %: 72 %
Platelets: 216 10*3/uL (ref 150–400)
RBC: 4.55 MIL/uL (ref 3.87–5.11)
RDW: 16.1 % — ABNORMAL HIGH (ref 11.5–15.5)
WBC: 7.9 10*3/uL (ref 4.0–10.5)

## 2015-10-31 LAB — URINALYSIS, ROUTINE W REFLEX MICROSCOPIC
Bilirubin Urine: NEGATIVE
Glucose, UA: 250 mg/dL — AB
Ketones, ur: NEGATIVE mg/dL
Nitrite: NEGATIVE
Protein, ur: >300 mg/dL — AB
Specific Gravity, Urine: 1.015 (ref 1.005–1.030)
pH: 5.5 (ref 5.0–8.0)

## 2015-10-31 LAB — BASIC METABOLIC PANEL
Anion gap: 8 (ref 5–15)
BUN: 20 mg/dL (ref 6–20)
CO2: 21 mmol/L — ABNORMAL LOW (ref 22–32)
Calcium: 8.2 mg/dL — ABNORMAL LOW (ref 8.9–10.3)
Chloride: 103 mmol/L (ref 101–111)
Creatinine, Ser: 1.57 mg/dL — ABNORMAL HIGH (ref 0.44–1.00)
GFR calc Af Amer: 46 mL/min — ABNORMAL LOW (ref >60)
GFR calc non Af Amer: 40 mL/min — ABNORMAL LOW (ref >60)
Glucose, Bld: 328 mg/dL — ABNORMAL HIGH (ref 65–99)
Potassium: 4.4 mmol/L (ref 3.5–5.1)
Sodium: 132 mmol/L — ABNORMAL LOW (ref 135–145)

## 2015-10-31 LAB — URINE MICROSCOPIC-ADD ON

## 2015-10-31 LAB — I-STAT CG4 LACTIC ACID, ED: Lactic Acid, Venous: 0.95 mmol/L (ref 0.5–2.0)

## 2015-10-31 MED ORDER — ACETAMINOPHEN-CODEINE 120-12 MG/5ML PO SOLN
10.0000 mL | ORAL | Status: AC | PRN
Start: 2015-10-31 — End: ?

## 2015-10-31 MED ORDER — SODIUM CHLORIDE 0.9 % IV BOLUS (SEPSIS)
1000.0000 mL | Freq: Once | INTRAVENOUS | Status: AC
  Administered 2015-10-31: 1000 mL via INTRAVENOUS

## 2015-10-31 MED ORDER — GUAIFENESIN ER 1200 MG PO TB12: 1.0000 | ORAL_TABLET | Freq: Two times a day (BID) | ORAL | Status: AC

## 2015-10-31 MED ORDER — ACETAMINOPHEN 325 MG PO TABS
650.0000 mg | ORAL_TABLET | Freq: Once | ORAL | Status: AC
  Administered 2015-10-31: 650 mg via ORAL
  Filled 2015-10-31: qty 2

## 2015-10-31 NOTE — ED Notes (Signed)
Pt unable to provide urine sample at the moment.

## 2015-10-31 NOTE — ED Notes (Signed)
Chest pain followed by cough x 3 days.

## 2015-10-31 NOTE — ED Notes (Signed)
Pt medical necessity documented in error.

## 2015-10-31 NOTE — ED Provider Notes (Signed)
CSN: [id number]     Arrival date & time 10/31/15  1740 History   First MD Initiated Contact with Patient 10/31/15 1755     Chief Complaint  Patient presents with  . Cough     (Consider location/radiation/quality/duration/timing/severity/associated sxs/prior Treatment) HPI Patient presents to the emergency department with cough, nasal congestion, and body aches over the last 3 days.  The patient states that her chest hurts when she coughs.  She states that she was seen by the nurse at her recent appointment advised to come here.  She may have a blood clot.  The patient states that she has not had any leg swelling or leg pain.  The patient states she did not take any medications prior to arrival.  Nothing seems to make the condition better or worse.  She states that she does not have any shortness of breath, nausea, vomiting, weakness, dizziness, headache, blurred vision, dysuria, incontinence, bloody stool, hematemesis, abdominal pain, rash, edema, near syncope or syncope.  The patient states that she was given a Z-Pak a little over a week ago for prevention of any respiratory illness Past Medical History  Diagnosis Date  . IDDM (insulin dependent diabetes mellitus) ([place])   . Hyperlipidemia   . Hypertension   . GERD (gastroesophageal reflux disease)   . Bronchitis   . Asthma   . Peripheral edema   . Insomnia   . Obesity   . Diabetes mellitus    Past Surgical History  Procedure Laterality Date  . No past surgeries    . Cholecystectomy  02/03/2012    Procedure: LAPAROSCOPIC CHOLECYSTECTOMY;  Surgeon: Gwenyth Ober, MD;  Location: [facility] OR;  Service: General;  Laterality: N/A;   Family History  Problem Relation Age of Onset  . Ovarian cancer Maternal Grandmother   . Ovarian cancer Maternal Aunt   . Heart failure Mother   . Aneurysm Father    Social History  Substance Use Topics  . Smoking status: Never Smoker   . Smokeless tobacco: None  . Alcohol Use: No   OB History    No data  available     Review of Systems  All other systems negative except as documented in the HPI. All pertinent positives and negatives as reviewed in the HPI.  Allergies  Sulfa antibiotics  Home Medications   Prior to Admission medications   Medication Sig Start Date End Date Taking? Authorizing Provider  acetaminophen-codeine 120-12 MG/5ML solution Take 10 mLs by mouth every 4 (four) hours as needed for moderate pain. 10/31/15   Fintan Grater, PA-C  Albiglutide (TANZEUM) 30 MG PEN Inject 30 mg into the skin every 7 (seven) days. On Sundays    Historical Provider, MD  albuterol (PROVENTIL HFA;VENTOLIN HFA) 108 (90 BASE) MCG/ACT inhaler Inhale 2 puffs into the lungs every 4 (four) hours as needed for wheezing or shortness of breath (For coughing.).    Historical Provider, MD  aspirin EC 81 MG tablet Take 81 mg by mouth daily.    Historical Provider, MD  atorvastatin (LIPITOR) 10 MG tablet Take 10 mg by mouth daily.    Historical Provider, MD  Cholecalciferol (VITAMIN D3) 5000 UNITS CAPS Take 5,000 Units by mouth every 7 (seven) days. On Sundays    Historical Provider, MD  cyanocobalamin 500 MCG tablet Take 500 mcg by mouth daily.    Historical Provider, MD  ferrous gluconate (FERGON) 324 MG tablet Take 1 tablet (324 mg total) by mouth 3 (three) times daily with meals. Patient taking  differently: Take 324 mg by mouth daily with breakfast.  06/07/13   Megan Morris, DO  Guaifenesin 1200 MG TB12 Take 1 tablet (1,200 mg total) by mouth 2 (two) times daily. 10/31/15   Adeyemi Hamad, PA-C  insulin NPH-insulin regular (NOVOLIN 70/30) (70-30) 100 UNIT/ML injection Inject 25-40 Units into the skin 2 (two) times daily with a meal. 40 units in the morning and 25 units in the evening    Historical Provider, MD  losartan-hydrochlorothiazide (HYZAAR) 100-25 MG per tablet Take 1 tablet by mouth daily.    Historical Provider, MD  medroxyPROGESTERone (PROVERA) 10 MG tablet Take 1 tablet (10 mg total) by  mouth daily. 02/03/15   Megan Morris, DO  metFORMIN (GLUCOPHAGE) 1000 MG tablet Take 1,000 mg by mouth 2 (two) times daily with a meal.    Historical Provider, MD  pantoprazole (PROTONIX) 40 MG tablet Take 40 mg by mouth daily.    Historical Provider, MD   BP 128/65 mmHg  Pulse 101  Temp(Src) 99.9 F (37.7 C) (Oral)  Resp 20  Ht 5\' 7"  (1.702 m)  Wt 140.615 kg  BMI 48.54 kg/m2  SpO2 100% Physical Exam  Constitutional: She is oriented to person, place, and time. She appears well-developed and well-nourished. No distress.  HENT:  Head: Normocephalic and atraumatic.  Mouth/Throat: Oropharynx is clear and moist.  Eyes: Pupils are equal, round, and reactive to light.  Neck: Normal range of motion. Neck supple.  Cardiovascular: Normal rate, regular rhythm and normal heart sounds.  Exam reveals no gallop and no friction rub.   No murmur heard. Pulmonary/Chest: Effort normal and breath sounds normal. No respiratory distress. She has no wheezes.  Abdominal: Soft. Bowel sounds are normal. She exhibits no distension. There is no tenderness.  Musculoskeletal: She exhibits no edema.  Neurological: She is alert and oriented to person, place, and time. She exhibits normal muscle tone. Coordination normal.  Skin: Skin is warm and dry. No rash noted. No erythema.  Psychiatric: She has a normal mood and affect. Her behavior is normal.  Nursing note and vitals reviewed.   ED Course  Procedures (including critical care time) Labs Review Labs Reviewed  BASIC METABOLIC PANEL - Abnormal; Notable for the following:    Sodium 132 (*)    CO2 21 (*)    Glucose, Bld 328 (*)    Creatinine, Ser 1.57 (*)    Calcium 8.2 (*)    GFR calc non Af Amer 40 (*)    GFR calc Af Amer 46 (*)    All other components within normal limits  CBC WITH DIFFERENTIAL/PLATELET - Abnormal; Notable for the following:    Hemoglobin 10.6 (*)    HCT 34.0 (*)    MCV 74.7 (*)    MCH 23.3 (*)    RDW 16.1 (*)    All other  components within normal limits  URINALYSIS, ROUTINE W REFLEX MICROSCOPIC (NOT AT [facility] - Abnormal; Notable for the following:    APPearance CLOUDY (*)    Glucose, UA 250 (*)    Hgb urine dipstick LARGE (*)    Protein, ur >300 (*)    Leukocytes, UA SMALL (*)    All other components within normal limits  URINE MICROSCOPIC-ADD ON - Abnormal; Notable for the following:    Squamous Epithelial / LPF 0-5 (*)    Bacteria, UA FEW (*)    All other components within normal limits  URINE CULTURE  D-DIMER, QUANTITATIVE (NOT AT [facility])  I-STAT CG4 LACTIC ACID, ED  Imaging Review Dg Chest 2 View  10/31/2015  CLINICAL DATA:  Fever, cough and congestion, bilateral lower extremity swelling. EXAM: CHEST  2 VIEW COMPARISON:  Chest x-ray dated 05/10/2011. FINDINGS: Cardiomediastinal silhouette is normal in size and configuration. Lungs are clear. Lung volumes are normal. No evidence of pneumonia. No pleural effusion. No pneumothorax. Osseous and soft tissue structures about the chest are unremarkable. IMPRESSION: No active cardiopulmonary disease. Electronically Signed   By: Franki Cabot M.D.   On: 10/31/2015 19:39   I have personally reviewed and evaluated these images and lab results as part of my medical decision-making.  Patient be treated for influenza-like illness.  Told to return here as needed.  The patient is not septic appearing at this time and her laboratory testing is reassuring that she does not have sepsis.  Patient agrees the plan.  I have advised follow-up with her primary care Dr. told to return here as needed  Dalia Heading, PA-C 11/01/15 [place], MD 11/11/15 2259

## 2015-10-31 NOTE — Discharge Instructions (Signed)
Return here as needed.  Follow-up with your doctor for a recheck.  Increase your fluid intake. °

## 2015-10-31 NOTE — ED Notes (Signed)
Went over in complete detail discharge instructions and all labs with pt.   Pt. Verbalized understanding of discharge instructions.  Pt. Verbally discontent at time of discharge with out accepting any offers of speaking to [place] the charge RN and Pt. Said No.  RN very cooperative with Pt.

## 2015-11-03 LAB — URINE CULTURE: Culture: 20000

## 2015-11-04 ENCOUNTER — Telehealth (HOSPITAL_BASED_OUTPATIENT_CLINIC_OR_DEPARTMENT_OTHER): Payer: Self-pay | Admitting: Emergency Medicine

## 2015-11-04 NOTE — Progress Notes (Signed)
ED Antimicrobial Stewardship Positive Culture Follow Up   Andrea Wang is an 45 y.o. female who presented to [facility] on 10/31/2015 with a chief complaint of  Chief Complaint  Patient presents with  . Cough    Recent Results (from the past 720 hour(s))  Urine culture     Status: None   Collection Time: 10/31/15 10:29 PM  Result Value Ref Range Status   Specimen Description URINE, CLEAN CATCH  Final   Special Requests NONE  Final   Culture   Final    20,000 COLONIES/mL ESCHERICHIA COLI Performed at [facility]    Report Status 11/03/2015 FINAL  Final   Organism ID, Bacteria ESCHERICHIA COLI  Final      Susceptibility   Escherichia coli - MIC*    AMPICILLIN >=32 RESISTANT Resistant     CEFAZOLIN >=64 RESISTANT Resistant     CEFTRIAXONE <=1 SENSITIVE Sensitive     CIPROFLOXACIN <=0.25 SENSITIVE Sensitive     GENTAMICIN <=1 SENSITIVE Sensitive     IMIPENEM <=0.25 SENSITIVE Sensitive     NITROFURANTOIN <=16 SENSITIVE Sensitive     TRIMETH/SULFA <=20 SENSITIVE Sensitive     AMPICILLIN/SULBACTAM >=32 RESISTANT Resistant     PIP/TAZO <=4 SENSITIVE Sensitive     * 20,000 COLONIES/mL ESCHERICHIA COLI   Asymptomatic bacteruria, no tx indicated  ED Provider: Delrae Rend, Esau Grew 11/04/2015, 9:04 AM Infectious Diseases Pharmacist Phone# [phone]

## 2015-11-04 NOTE — Telephone Encounter (Signed)
Post ED Visit - Positive Culture Follow-up  Culture report reviewed by antimicrobial stewardship pharmacist:  []  Elenor Quinones, Pharm.D. []  Heide Guile, Pharm.D., BCPS [x]  Parks Neptune, Pharm.D. []  Alycia Rossetti, Pharm.D., BCPS []  [place], [place].D., BCPS, AAHIVP []  Legrand Como, Pharm.D., BCPS, AAHIVP []  Milus Glazier, Pharm.D. []  Stephens November, Pharm.D.  Positive urine culture  Treated with none, asymptomatic, no further patient follow-up is required at this time.  Hazle Nordmann 11/04/2015, 10:03 AM

## 2017-01-02 ENCOUNTER — Other Ambulatory Visit: Payer: Self-pay | Admitting: Obstetrics & Gynecology

## 2017-01-02 DIAGNOSIS — N6320 Unspecified lump in the left breast, unspecified quadrant: Secondary | ICD-10-CM

## 2017-01-07 ENCOUNTER — Ambulatory Visit
Admission: RE | Admit: 2017-01-07 | Discharge: 2017-01-07 | Disposition: A | Discharge status: Home / Self Care | Payer: Managed Care, Other (non HMO) | Source: Ambulatory Visit | Attending: Obstetrics & Gynecology | Admitting: Obstetrics & Gynecology

## 2017-01-07 DIAGNOSIS — N6320 Unspecified lump in the left breast, unspecified quadrant: Secondary | ICD-10-CM

## 2018-02-25 ENCOUNTER — Other Ambulatory Visit: Payer: Self-pay | Admitting: Surgery

## 2018-05-08 ENCOUNTER — Emergency Department (HOSPITAL_BASED_OUTPATIENT_CLINIC_OR_DEPARTMENT_OTHER): Payer: 59

## 2018-05-08 ENCOUNTER — Other Ambulatory Visit: Payer: Self-pay

## 2018-05-08 ENCOUNTER — Encounter (HOSPITAL_BASED_OUTPATIENT_CLINIC_OR_DEPARTMENT_OTHER): Payer: Self-pay | Admitting: *Deleted

## 2018-05-08 ENCOUNTER — Emergency Department (HOSPITAL_BASED_OUTPATIENT_CLINIC_OR_DEPARTMENT_OTHER)
Admission: EM | Admit: 2018-05-08 | Discharge: 2018-05-08 | Disposition: A | Discharge status: Home / Self Care | Payer: 59 | Attending: Emergency Medicine | Admitting: Emergency Medicine

## 2018-05-08 DIAGNOSIS — J45909 Unspecified asthma, uncomplicated: Secondary | ICD-10-CM | POA: Diagnosis not present

## 2018-05-08 DIAGNOSIS — M542 Cervicalgia: Secondary | ICD-10-CM | POA: Diagnosis not present

## 2018-05-08 DIAGNOSIS — Y939 Activity, unspecified: Secondary | ICD-10-CM | POA: Diagnosis not present

## 2018-05-08 DIAGNOSIS — Y929 Unspecified place or not applicable: Secondary | ICD-10-CM | POA: Insufficient documentation

## 2018-05-08 DIAGNOSIS — E119 Type 2 diabetes mellitus without complications: Secondary | ICD-10-CM | POA: Diagnosis not present

## 2018-05-08 DIAGNOSIS — R51 Headache: Secondary | ICD-10-CM | POA: Insufficient documentation

## 2018-05-08 DIAGNOSIS — Y999 Unspecified external cause status: Secondary | ICD-10-CM | POA: Insufficient documentation

## 2018-05-08 DIAGNOSIS — W0110XA Fall on same level from slipping, tripping and stumbling with subsequent striking against unspecified object, initial encounter: Secondary | ICD-10-CM | POA: Insufficient documentation

## 2018-05-08 DIAGNOSIS — I10 Essential (primary) hypertension: Secondary | ICD-10-CM | POA: Diagnosis not present

## 2018-05-08 DIAGNOSIS — W19XXXA Unspecified fall, initial encounter: Secondary | ICD-10-CM

## 2018-05-08 MED ORDER — KETOROLAC TROMETHAMINE 60 MG/2ML IM SOLN
60.0000 mg | Freq: Once | INTRAMUSCULAR | Status: AC
  Administered 2018-05-08: 60 mg via INTRAMUSCULAR
  Filled 2018-05-08: qty 2

## 2018-05-08 MED ORDER — FENTANYL CITRATE (PF) 100 MCG/2ML IJ SOLN
50.0000 ug | Freq: Once | INTRAMUSCULAR | Status: AC
  Administered 2018-05-08: 50 ug via INTRAMUSCULAR
  Filled 2018-05-08: qty 2

## 2018-05-08 NOTE — ED Triage Notes (Signed)
She hit the back of her head yesterday when she was getting out of the shower, slipped and fell yesterday. Headache, neck pain and vomiting. She is alert, smiling and ambulatory.

## 2018-05-08 NOTE — ED Provider Notes (Signed)
Emergency Department Provider Note   I have reviewed the triage vital signs and the nursing notes.   HISTORY  Chief Complaint Fall   HPI Andrea Wang is a 47 y.o. female total medical problems as documented below the presents to the emergency department today after a fall.  Patient states that she slipped on some water fell and hit her head and subsequently has been having headache and neck pain.  She also had episode of mild vomiting so her sister who is a nurse at told her to come to the emergency room immediately for further evaluation.  Patient is denying blood thinners.  She has no neurologic symptoms.  She has no injuries elsewhere.  Did not syncopized.  No vision changes. No other associated or modifying symptoms.    Past Medical History:  Diagnosis Date  . Asthma   . Bronchitis   . Diabetes mellitus   . GERD (gastroesophageal reflux disease)   . Hyperlipidemia   . Hypertension   . IDDM (insulin dependent diabetes mellitus) ([place])   . Insomnia   . Obesity   . Peripheral edema     Patient Active Problem List   Diagnosis Date Noted  . Anemia 02/03/2015  . Postop check 02/25/2012  . Biliary dyskinesia 11/26/2011    Past Surgical History:  Procedure Laterality Date  . CHOLECYSTECTOMY  02/03/2012   Procedure: LAPAROSCOPIC CHOLECYSTECTOMY;  Surgeon: Gwenyth Ober, MD;  Location: [place];  Service: General;  Laterality: N/A;  . NO PAST SURGERIES      Current Outpatient Rx  . Order #: [id number] Class: Print  . Order #: [id number] Class: Historical Med  . Order #: [id number] Class: Historical Med  . Order #: [id number] Class: Historical Med  . Order #: [id number] Class: Historical Med  . Order #: [id number] Class: Historical Med  . Order #: [id number] Class: Historical Med  . Order #: [id number] Class: Normal  . Order #: [id number] Class: Print  . Order #: [id number] Class: Historical Med  . Order #: [id number] Class: Historical Med  . Order #: [id number] Class: Normal  . Order #:  [id number] Class: Historical Med  . Order #: [id number] Class: Historical Med    Allergies Sulfa antibiotics  Family History  Problem Relation Age of Onset  . Ovarian cancer Maternal Grandmother   . Ovarian cancer Maternal Aunt   . Heart failure Mother   . Aneurysm Father   . Breast cancer Paternal Aunt     Social History Social History   Tobacco Use  . Smoking status: Never Smoker  . Smokeless tobacco: Never Used  Substance Use Topics  . Alcohol use: No  . Drug use: No    Review of Systems  All other systems negative except as documented in the HPI. All pertinent positives and negatives as reviewed in the HPI. ____________________________________________   PHYSICAL EXAM:  VITAL SIGNS: ED Triage Vitals  Enc Vitals Group     BP 05/08/18 1556 (!) 176/88     Pulse Rate 05/08/18 1556 80     Resp 05/08/18 1556 18     Temp 05/08/18 1556 98.5 F (36.9 C)     Temp Source 05/08/18 1556 Oral     SpO2 05/08/18 1556 100 %     Weight 05/08/18 1554 285 lb (129.3 kg)     Height 05/08/18 1554 5\' 7"  (1.702 m)     Head Circumference --      Peak Flow --      Pain Score 05/08/18 1554 7     Pain Loc --  Pain Edu? --      Excl. in [place]? --     Constitutional: Alert and oriented. Well appearing and in no acute distress. Eyes: Conjunctivae are normal. PERRL. EOMI. Head: Atraumatic. Nose: No congestion/rhinnorhea. Mouth/Throat: Mucous membranes are moist.  Oropharynx non-erythematous. Neck: No stridor.  No meningeal signs.   Cardiovascular: Normal rate, regular rhythm. Good peripheral circulation. Grossly normal heart sounds.   Respiratory: Normal respiratory effort.  No retractions. Lungs CTAB. Gastrointestinal: Soft and nontender. No distention.  Musculoskeletal: No lower extremity tenderness nor edema. No gross deformities of extremities. Neurologic:  Normal speech and language. No gross focal neurologic deficits are appreciated.  Skin:  Skin is warm, dry and intact. No rash  noted.  ____________________________________________   INITIAL IMPRESSION / ASSESSMENT AND PLAN / ED COURSE  Eval for traumatic injury of head and neck.  If this is negative can be discharged.  Workup negative. Improved symptoms. Stable for discharge.   Pertinent labs & imaging results that were available during my care of the patient were reviewed by me and considered in my medical decision making (see chart for details).  ____________________________________________  FINAL CLINICAL IMPRESSION(S) / ED DIAGNOSES  Final diagnoses:  Fall, initial encounter    MEDICATIONS GIVEN DURING THIS VISIT:  Medications  fentaNYL (SUBLIMAZE) injection 50 mcg (50 mcg Intramuscular Given 05/08/18 1756)  ketorolac (TORADOL) injection 60 mg (60 mg Intramuscular Given 05/08/18 1947)    NEW OUTPATIENT MEDICATIONS STARTED DURING THIS VISIT:  Discharge Medication List as of 05/08/2018  7:40 PM      Note:  This note was prepared with assistance of Dragon voice recognition software. Occasional wrong-word or sound-a-like substitutions may have occurred due to the inherent limitations of voice recognition software.   Merrily Pew, MD 05/08/18 2130

## 2019-05-01 IMAGING — CT CT CERVICAL SPINE W/O CM
4 of 7 series · 14 of 33 positions shown, 15 images · non-contrast
Comparison: None.

CLINICAL DATA: Patient fell 2 weeks ago in the shower hitting back
of head and fell again yesterday. Pain down right side of arm.

EXAM:
CT HEAD WITHOUT CONTRAST
CT CERVICAL SPINE WITHOUT CONTRAST
TECHNIQUE: Multidetector CT imaging of the head and cervical spine was
performed following the standard protocol without intravenous
contrast. Multiplanar CT image reconstructions of the cervical spine
were also generated.

[Series 7: c_spine 2.0 i30s 3 · axial · 0.33mm/px · z∈[-303,-211]mm · 4 of 78 slices shown, 5 images]
[im 16/78  soft-tissue]
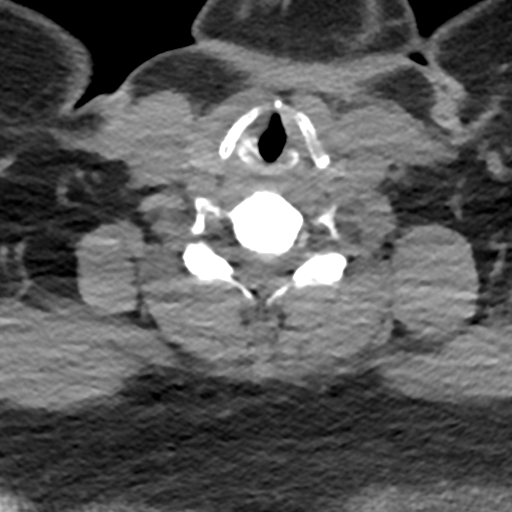
[im 16/78  bone]
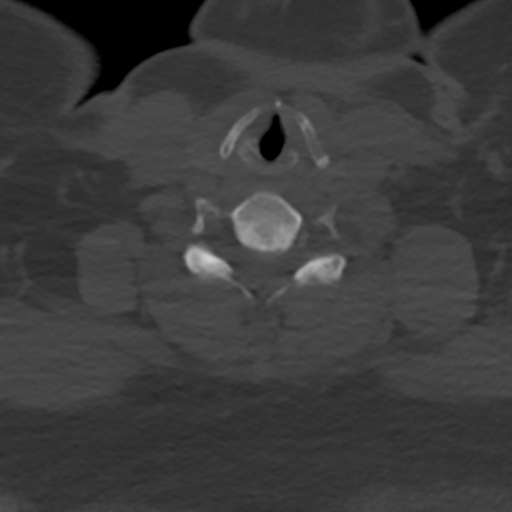
[im 31/78  bone]
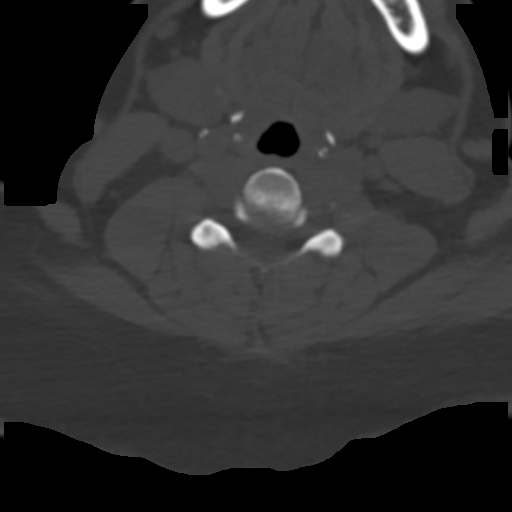
[im 47/78  bone]
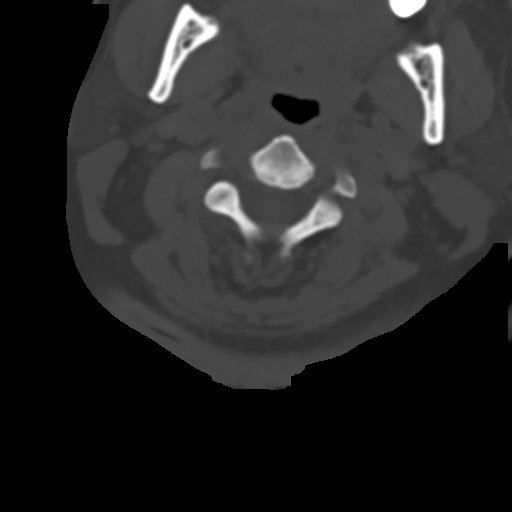
[im 62/78  bone]
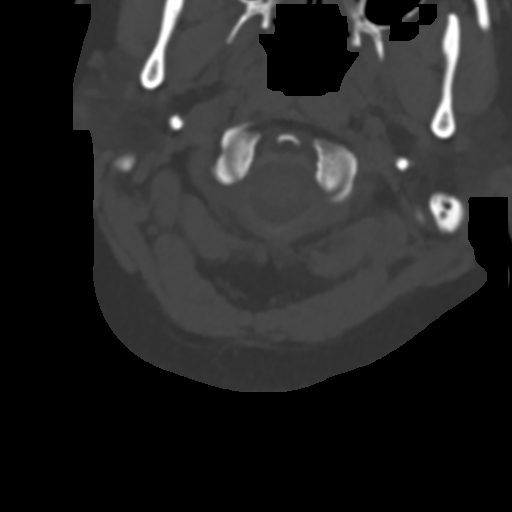

[Series 9: coronals · coronal · 0.27mm/px · 1 of 66 slices shown]
[im 33/66  bone]
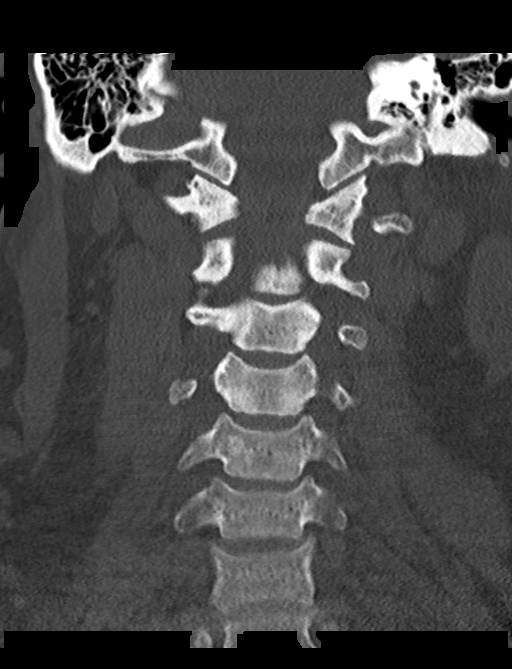

[Series 10: sagittals · sagittal · 0.23mm/px · 5 of 61 slices shown]
[im 11/61  bone]
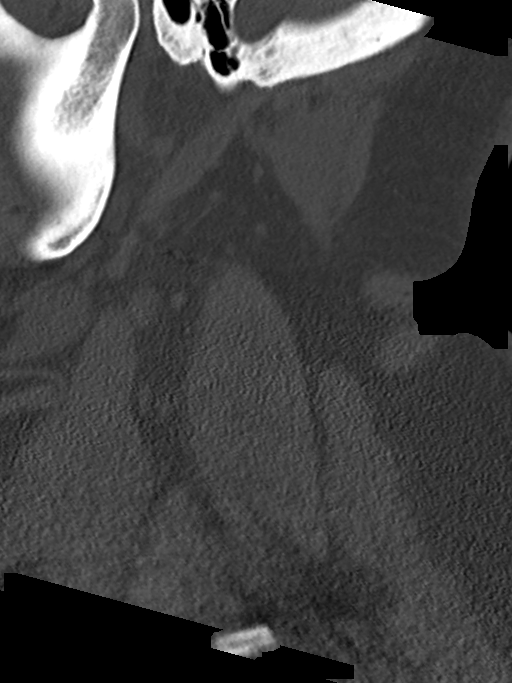
[im 21/61  bone]
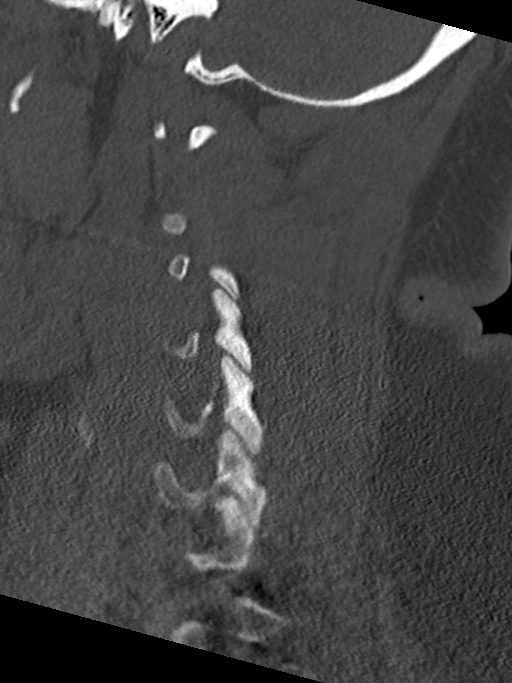
[im 31/61  bone]
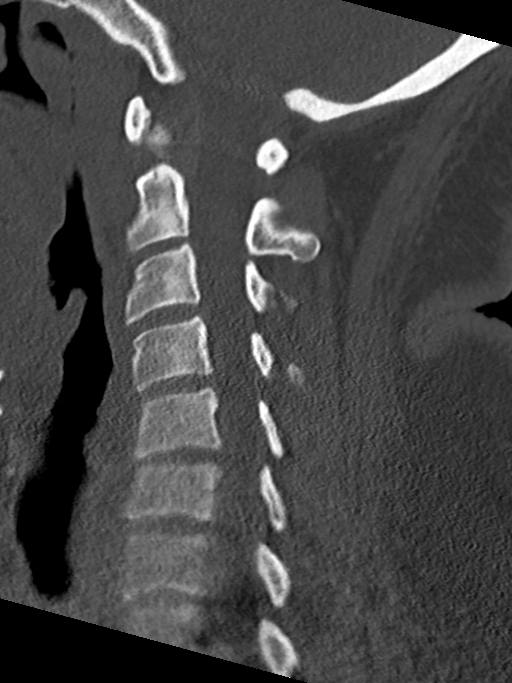
[im 41/61  bone]
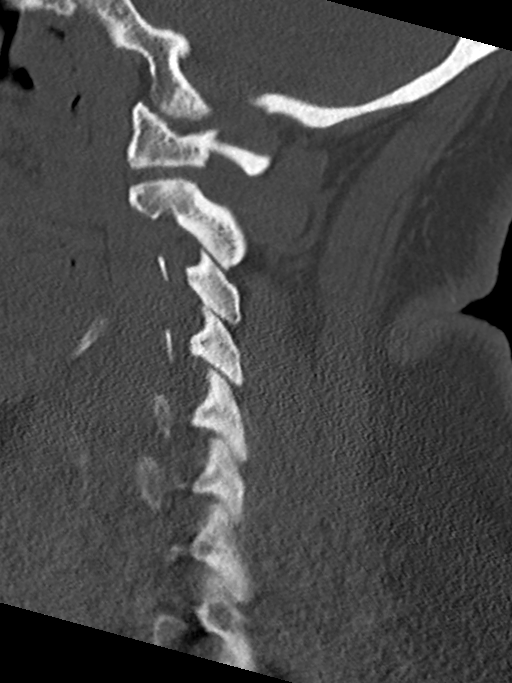
[im 51/61  bone]
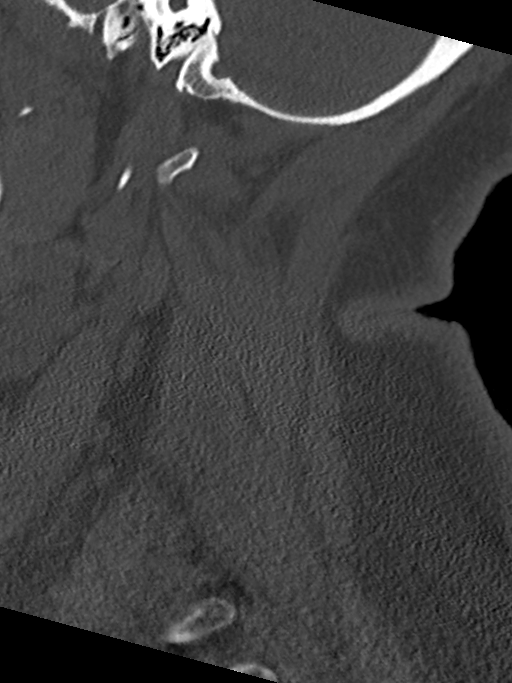

[Series 11: orthogonals · axial · 0.23mm/px · z∈[-325,-227]mm · 4 of 84 slices shown]
[im 17/84  bone]
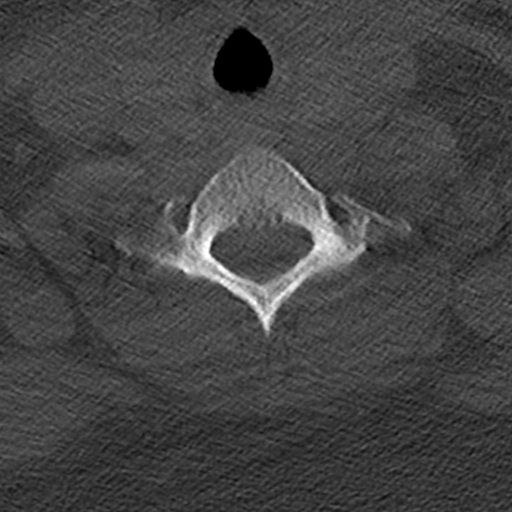
[im 34/84  bone]
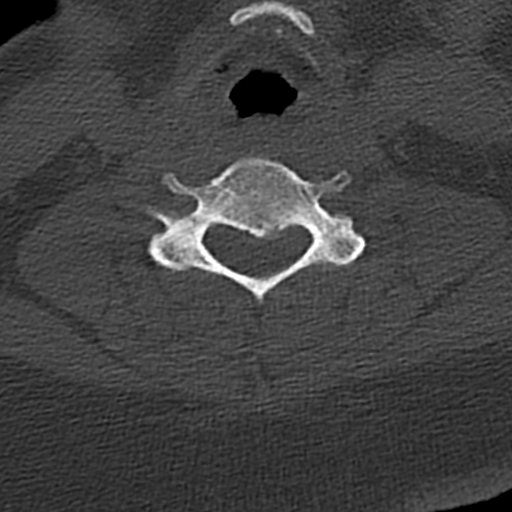
[im 50/84  bone]
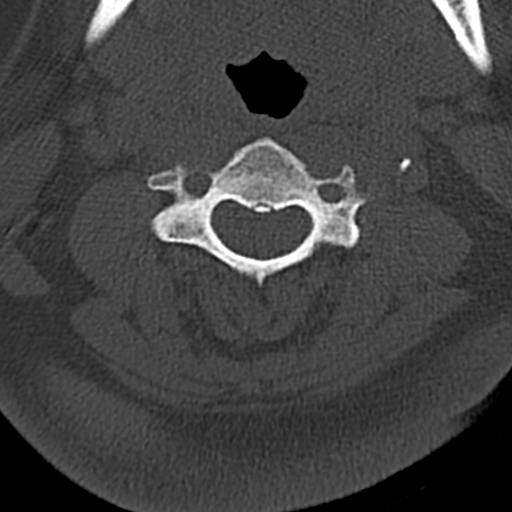
[im 67/84  bone]
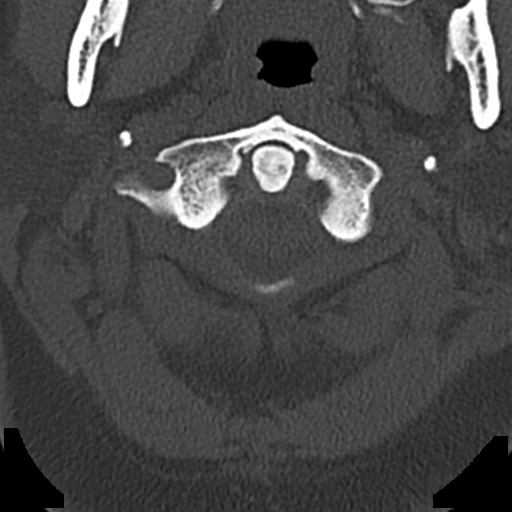

[14 of 33 positions shown; findings below may reference images not displayed]

FINDINGS: CT HEAD FINDINGS

BRAIN: The ventricles and sulci are normal. No intraparenchymal
hemorrhage, mass effect nor midline shift. No acute large vascular
territory infarcts. No abnormal extra-axial fluid collections. Basal
cisterns are midline and not effaced. No acute cerebellar
abnormality.

VASCULAR: Unremarkable.

SKULL/SOFT TISSUES: No skull fracture. No significant soft tissue
swelling.

ORBITS/SINUSES: The included ocular globes and orbital contents are
normal.The mastoid air-cells and included paranasal sinuses are
well-aerated.

OTHER: None.

CT CERVICAL SPINE FINDINGS

ALIGNMENT: Vertebral bodies in alignment. Reversal cervical lordosis
which may be positional or due to patient muscle spasm.

SKULL BASE AND VERTEBRAE: Cervical vertebral bodies and posterior
elements are intact. Intervertebral disc heights preserved. No
destructive bony lesions. C1-2 articulation maintained.

SOFT TISSUES AND SPINAL CANAL: Normal.

DISC LEVELS: No significant osseous canal stenosis or neural
foraminal narrowing. Slight disc space narrowing C5-6. Minimal
central disc bulges C2-3 and C3-4. Patient's body habitus and
shoulder artifacts limit assessment of the lower cervical discs.

UPPER CHEST: Lung apices are clear.

OTHER: None.
IMPRESSION: 1. Normal head CT.
2. No acute cervical spine fracture or static listhesis.
3. Mild central disc bulges C2-3 C3-4. Slight disc space also noted
at C5-6 though assessment of the disc is limited by patient's
shoulder artifacts and body habitus.

## 2020-08-10 DIAGNOSIS — Z20822 Contact with and (suspected) exposure to covid-19: Secondary | ICD-10-CM | POA: Diagnosis not present

## 2020-08-10 DIAGNOSIS — I152 Hypertension secondary to endocrine disorders: Secondary | ICD-10-CM | POA: Diagnosis not present

## 2020-08-10 DIAGNOSIS — J029 Acute pharyngitis, unspecified: Secondary | ICD-10-CM | POA: Diagnosis not present

## 2020-08-10 DIAGNOSIS — E1159 Type 2 diabetes mellitus with other circulatory complications: Secondary | ICD-10-CM | POA: Diagnosis not present

## 2020-08-10 DIAGNOSIS — J069 Acute upper respiratory infection, unspecified: Secondary | ICD-10-CM | POA: Diagnosis not present

## 2020-12-12 DIAGNOSIS — Z794 Long term (current) use of insulin: Secondary | ICD-10-CM | POA: Diagnosis not present

## 2020-12-12 DIAGNOSIS — H04123 Dry eye syndrome of bilateral lacrimal glands: Secondary | ICD-10-CM | POA: Diagnosis not present

## 2020-12-12 DIAGNOSIS — H35033 Hypertensive retinopathy, bilateral: Secondary | ICD-10-CM | POA: Diagnosis not present

## 2020-12-12 DIAGNOSIS — H524 Presbyopia: Secondary | ICD-10-CM | POA: Diagnosis not present

## 2020-12-12 DIAGNOSIS — H43393 Other vitreous opacities, bilateral: Secondary | ICD-10-CM | POA: Diagnosis not present

## 2020-12-12 DIAGNOSIS — E119 Type 2 diabetes mellitus without complications: Secondary | ICD-10-CM | POA: Diagnosis not present

## 2020-12-12 DIAGNOSIS — H25013 Cortical age-related cataract, bilateral: Secondary | ICD-10-CM | POA: Diagnosis not present

## 2020-12-12 DIAGNOSIS — H52203 Unspecified astigmatism, bilateral: Secondary | ICD-10-CM | POA: Diagnosis not present

## 2020-12-12 DIAGNOSIS — E113393 Type 2 diabetes mellitus with moderate nonproliferative diabetic retinopathy without macular edema, bilateral: Secondary | ICD-10-CM | POA: Diagnosis not present

## 2020-12-12 DIAGNOSIS — H5213 Myopia, bilateral: Secondary | ICD-10-CM | POA: Diagnosis not present

## 2021-06-29 DIAGNOSIS — R82998 Other abnormal findings in urine: Secondary | ICD-10-CM | POA: Diagnosis not present

## 2021-06-29 DIAGNOSIS — R319 Hematuria, unspecified: Secondary | ICD-10-CM | POA: Diagnosis not present

## 2021-06-29 DIAGNOSIS — R3129 Other microscopic hematuria: Secondary | ICD-10-CM | POA: Diagnosis not present

## 2021-06-29 DIAGNOSIS — R109 Unspecified abdominal pain: Secondary | ICD-10-CM | POA: Diagnosis not present

## 2021-09-05 DIAGNOSIS — E1165 Type 2 diabetes mellitus with hyperglycemia: Secondary | ICD-10-CM | POA: Diagnosis not present

## 2022-12-16 DIAGNOSIS — Z23 Encounter for immunization: Secondary | ICD-10-CM | POA: Diagnosis not present

## 2023-02-11 DIAGNOSIS — N183 Chronic kidney disease, stage 3 unspecified: Secondary | ICD-10-CM | POA: Diagnosis not present

## 2023-02-11 DIAGNOSIS — E1122 Type 2 diabetes mellitus with diabetic chronic kidney disease: Secondary | ICD-10-CM | POA: Diagnosis not present

## 2023-02-11 DIAGNOSIS — E1165 Type 2 diabetes mellitus with hyperglycemia: Secondary | ICD-10-CM | POA: Diagnosis not present

## 2023-02-11 DIAGNOSIS — I152 Hypertension secondary to endocrine disorders: Secondary | ICD-10-CM | POA: Diagnosis not present

## 2023-02-11 DIAGNOSIS — Z91199 Patient's noncompliance with other medical treatment and regimen due to unspecified reason: Secondary | ICD-10-CM | POA: Diagnosis not present

## 2023-02-11 DIAGNOSIS — E1159 Type 2 diabetes mellitus with other circulatory complications: Secondary | ICD-10-CM | POA: Diagnosis not present

## 2023-02-11 DIAGNOSIS — E7801 Familial hypercholesterolemia: Secondary | ICD-10-CM | POA: Diagnosis not present

## 2023-02-11 DIAGNOSIS — Z6841 Body Mass Index (BMI) 40.0 and over, adult: Secondary | ICD-10-CM | POA: Diagnosis not present

## 2023-02-27 DIAGNOSIS — E1122 Type 2 diabetes mellitus with diabetic chronic kidney disease: Secondary | ICD-10-CM | POA: Diagnosis not present

## 2023-02-27 DIAGNOSIS — N183 Chronic kidney disease, stage 3 unspecified: Secondary | ICD-10-CM | POA: Diagnosis not present

## 2023-03-19 DIAGNOSIS — Z91199 Patient's noncompliance with other medical treatment and regimen due to unspecified reason: Secondary | ICD-10-CM | POA: Diagnosis not present

## 2023-03-19 DIAGNOSIS — E1159 Type 2 diabetes mellitus with other circulatory complications: Secondary | ICD-10-CM | POA: Diagnosis not present

## 2023-03-19 DIAGNOSIS — E7801 Familial hypercholesterolemia: Secondary | ICD-10-CM | POA: Diagnosis not present

## 2023-03-19 DIAGNOSIS — Z6841 Body Mass Index (BMI) 40.0 and over, adult: Secondary | ICD-10-CM | POA: Diagnosis not present

## 2023-03-19 DIAGNOSIS — I152 Hypertension secondary to endocrine disorders: Secondary | ICD-10-CM | POA: Diagnosis not present

## 2023-03-19 DIAGNOSIS — E1122 Type 2 diabetes mellitus with diabetic chronic kidney disease: Secondary | ICD-10-CM | POA: Diagnosis not present

## 2023-03-19 DIAGNOSIS — E1165 Type 2 diabetes mellitus with hyperglycemia: Secondary | ICD-10-CM | POA: Diagnosis not present

## 2023-03-19 DIAGNOSIS — Z1331 Encounter for screening for depression: Secondary | ICD-10-CM | POA: Diagnosis not present

## 2023-03-19 DIAGNOSIS — N183 Chronic kidney disease, stage 3 unspecified: Secondary | ICD-10-CM | POA: Diagnosis not present

## 2023-07-21 DIAGNOSIS — H35033 Hypertensive retinopathy, bilateral: Secondary | ICD-10-CM | POA: Diagnosis not present

## 2023-07-21 DIAGNOSIS — Z7984 Long term (current) use of oral hypoglycemic drugs: Secondary | ICD-10-CM | POA: Diagnosis not present

## 2023-07-21 DIAGNOSIS — H5213 Myopia, bilateral: Secondary | ICD-10-CM | POA: Diagnosis not present

## 2023-07-21 DIAGNOSIS — H524 Presbyopia: Secondary | ICD-10-CM | POA: Diagnosis not present

## 2023-07-21 DIAGNOSIS — H43393 Other vitreous opacities, bilateral: Secondary | ICD-10-CM | POA: Diagnosis not present

## 2023-07-21 DIAGNOSIS — H04123 Dry eye syndrome of bilateral lacrimal glands: Secondary | ICD-10-CM | POA: Diagnosis not present

## 2023-07-21 DIAGNOSIS — Z794 Long term (current) use of insulin: Secondary | ICD-10-CM | POA: Diagnosis not present

## 2023-07-21 DIAGNOSIS — E113393 Type 2 diabetes mellitus with moderate nonproliferative diabetic retinopathy without macular edema, bilateral: Secondary | ICD-10-CM | POA: Diagnosis not present

## 2023-07-21 DIAGNOSIS — H52203 Unspecified astigmatism, bilateral: Secondary | ICD-10-CM | POA: Diagnosis not present

## 2023-07-21 DIAGNOSIS — H25013 Cortical age-related cataract, bilateral: Secondary | ICD-10-CM | POA: Diagnosis not present

## 2023-07-21 DIAGNOSIS — E119 Type 2 diabetes mellitus without complications: Secondary | ICD-10-CM | POA: Diagnosis not present

## 2024-01-05 DIAGNOSIS — Z794 Long term (current) use of insulin: Secondary | ICD-10-CM | POA: Diagnosis not present

## 2024-01-05 DIAGNOSIS — E113393 Type 2 diabetes mellitus with moderate nonproliferative diabetic retinopathy without macular edema, bilateral: Secondary | ICD-10-CM | POA: Diagnosis not present
